# Patient Record
Sex: Female | Born: 1970 | Race: White | Hispanic: No | Marital: Married | Smoking: Never smoker
Health system: Southern US, Community
[De-identification: ages and names within clinical notes are randomized; demographics above are authoritative.]

## PROBLEM LIST (undated history)

## (undated) DIAGNOSIS — J45909 Unspecified asthma, uncomplicated: Secondary | ICD-10-CM

## (undated) DIAGNOSIS — F909 Attention-deficit hyperactivity disorder, unspecified type: Secondary | ICD-10-CM

## (undated) DIAGNOSIS — E039 Hypothyroidism, unspecified: Secondary | ICD-10-CM

## (undated) DIAGNOSIS — R7989 Other specified abnormal findings of blood chemistry: Secondary | ICD-10-CM

## (undated) DIAGNOSIS — J309 Allergic rhinitis, unspecified: Secondary | ICD-10-CM

## (undated) HISTORY — DX: Unspecified asthma, uncomplicated: J45.909

## (undated) HISTORY — DX: Hypothyroidism, unspecified: E03.9

## (undated) HISTORY — DX: Other specified abnormal findings of blood chemistry: R79.89

## (undated) HISTORY — DX: Attention-deficit hyperactivity disorder, unspecified type: F90.9

## (undated) HISTORY — DX: Allergic rhinitis, unspecified: J30.9

---
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
## 1994-10-05 HISTORY — PX: TUBAL LIGATION: SHX77

## 2001-10-05 HISTORY — PX: BREAST ENHANCEMENT SURGERY: SHX7

## 2010-10-05 HISTORY — PX: INCISE AND DRAIN ABCESS: PRO64

## 2011-09-07 DIAGNOSIS — L290 Pruritus ani: Secondary | ICD-10-CM | POA: Insufficient documentation

## 2011-10-08 DIAGNOSIS — J309 Allergic rhinitis, unspecified: Secondary | ICD-10-CM | POA: Insufficient documentation

## 2012-06-29 DIAGNOSIS — R7989 Other specified abnormal findings of blood chemistry: Secondary | ICD-10-CM | POA: Insufficient documentation

## 2012-06-29 DIAGNOSIS — R635 Abnormal weight gain: Secondary | ICD-10-CM | POA: Insufficient documentation

## 2013-05-16 DIAGNOSIS — F988 Other specified behavioral and emotional disorders with onset usually occurring in childhood and adolescence: Secondary | ICD-10-CM | POA: Insufficient documentation

## 2013-08-14 ENCOUNTER — Ambulatory Visit (INDEPENDENT_AMBULATORY_CARE_PROVIDER_SITE_OTHER): Payer: BC Managed Care – PPO | Admitting: Critical Care Medicine

## 2013-08-14 ENCOUNTER — Encounter: Payer: Self-pay | Admitting: Critical Care Medicine

## 2013-08-14 VITALS — BP 122/86 | HR 77 | Temp 98.7°F | Ht 63.0 in | Wt 141.0 lb

## 2013-08-14 DIAGNOSIS — J45909 Unspecified asthma, uncomplicated: Secondary | ICD-10-CM

## 2013-08-14 DIAGNOSIS — J4551 Severe persistent asthma with (acute) exacerbation: Secondary | ICD-10-CM | POA: Insufficient documentation

## 2013-08-14 DIAGNOSIS — E039 Hypothyroidism, unspecified: Secondary | ICD-10-CM | POA: Insufficient documentation

## 2013-08-14 DIAGNOSIS — F909 Attention-deficit hyperactivity disorder, unspecified type: Secondary | ICD-10-CM | POA: Insufficient documentation

## 2013-08-14 DIAGNOSIS — J45901 Unspecified asthma with (acute) exacerbation: Secondary | ICD-10-CM

## 2013-08-14 MED ORDER — MOMETASONE FURO-FORMOTEROL FUM 200-5 MCG/ACT IN AERO: 2.0000 | INHALATION_SPRAY | Freq: Two times a day (BID) | RESPIRATORY_TRACT | Status: DC

## 2013-08-14 MED ORDER — ALBUTEROL SULFATE HFA 108 (90 BASE) MCG/ACT IN AERS: 2.0000 | INHALATION_SPRAY | RESPIRATORY_TRACT | Status: DC | PRN

## 2013-08-14 MED ORDER — MONTELUKAST SODIUM 10 MG PO TABS: 10.0000 mg | ORAL_TABLET | Freq: Every day | ORAL | Status: DC

## 2013-08-14 MED ORDER — PREDNISONE 10 MG PO TABS: ORAL_TABLET | ORAL | Status: DC

## 2013-08-14 MED ORDER — ALBUTEROL SULFATE (2.5 MG/3ML) 0.083% IN NEBU: 2.5000 mg | INHALATION_SOLUTION | RESPIRATORY_TRACT | Status: DC | PRN

## 2013-08-14 NOTE — Progress Notes (Signed)
  Subjective:    Patient ID: Alexis Duffy, female    DOB: July 20, 1971, 42 y.o.   MRN: [id number]  HPI Comments: Dx asthma since age 68. Chronic symptoms since.  Notes 68/69 pos allergy tests. Stopped allergy immunotherapy and did not do well Now doing poorly, using neb albuterol BID, SABA rescue BID  Asthma She complains of chest tightness, cough, difficulty breathing, frequent throat clearing, hoarse voice, shortness of breath, sputum production and wheezing. There is no hemoptysis. Primary symptoms comments: Cough is dry, if takes a breathing Tx will loosen up Mucus can be yellow/tinge. This is a chronic problem. The current episode started more than 1 year ago. The problem occurs constantly (much worse for past 7weeks, has nightly resp distress). The problem has been rapidly worsening. The cough is productive of sputum, barking, productive, hoarse, nocturnal and paroxysmal. Associated symptoms include dyspnea on exertion, malaise/fatigue, nasal congestion, PND, postnasal drip and rhinorrhea. Pertinent negatives include no appetite change, chest pain, ear congestion, ear pain, fever, headaches, heartburn, myalgias, orthopnea, sneezing, sore throat, sweats, trouble swallowing or weight loss. Associated symptoms comments: Two months ago dx otitis 45mo ago, frontal sinus pain No imaging done of sinuses. Her symptoms are aggravated by any activity, change in weather, exercise, emotional stress, exposure to smoke, exposure to fumes, occupational exposure, URI, strenuous activity, climbing stairs, animal exposure and pollen. Her symptoms are alleviated by beta-agonist, oral steroids and steroid inhaler. She reports moderate improvement on treatment. Risk factors for lung disease include smoking/tobacco exposure (passive smoke exposure). Her past medical history is significant for asthma and bronchitis. There is no history of bronchiectasis, COPD, emphysema or pneumonia.   Skin testing 2010. Asthma allergy  [facility] chest dr Chevis Pretty    Review of Systems  Constitutional: Positive for malaise/fatigue and fatigue. Negative for fever, chills, weight loss, diaphoresis, activity change, appetite change and unexpected weight change.  HENT: Positive for congestion, hoarse voice, postnasal drip, rhinorrhea and voice change. Negative for dental problem, ear discharge, ear pain, facial swelling, hearing loss, mouth sores, nosebleeds, sinus pressure, sneezing, sore throat, tinnitus and trouble swallowing.   Eyes: Negative for photophobia, discharge, itching and visual disturbance.  Respiratory: Positive for cough, sputum production, chest tightness, shortness of breath and wheezing. Negative for apnea, hemoptysis, choking and stridor.   Cardiovascular: Positive for dyspnea on exertion, leg swelling and PND. Negative for chest pain and palpitations.  Gastrointestinal: Negative for heartburn, nausea, vomiting, abdominal pain, constipation, blood in stool and abdominal distention.  Genitourinary: Negative for dysuria, urgency, frequency, hematuria, flank pain, decreased urine volume and difficulty urinating.  Musculoskeletal: Negative for arthralgias, back pain, gait problem, joint swelling, myalgias, neck pain and neck stiffness.  Skin: Negative for color change, pallor and rash.  Neurological: Negative for dizziness, tremors, seizures, syncope, speech difficulty, weakness, light-headedness, numbness and headaches.  Hematological: Negative for adenopathy. Does not bruise/bleed easily.  Psychiatric/Behavioral: Positive for sleep disturbance. Negative for confusion and agitation. The patient is not nervous/anxious.        Objective:   Physical Exam        Assessment & Plan:

## 2013-08-14 NOTE — Patient Instructions (Addendum)
REsume Dulera 200 two puff twice daily Repulse prednisone10mg  Take 4 for two days three for two days two for two days one for two days Use a peak flow meter to monitor your flow rates twice daily, use diary to record results Albuterol as needed refilled Stay on singulair REcords from prior physicians will be obtained, signed release Allergy labs today, possible Xolair candidate, sign forms for insurance screening Return 1 month

## 2013-08-15 ENCOUNTER — Encounter: Payer: Self-pay | Admitting: Critical Care Medicine

## 2013-08-15 ENCOUNTER — Telehealth: Payer: Self-pay | Admitting: Critical Care Medicine

## 2013-08-15 LAB — ALLERGY FULL PROFILE
Allergen, D pternoyssinus,d7: 7.7 kU/L — ABNORMAL HIGH
Allergen,Goose feathers, e70: 0.23 kU/L — ABNORMAL HIGH
Bahia Grass: 5.12 kU/L — ABNORMAL HIGH
Box Elder IgE: 2.26 kU/L — ABNORMAL HIGH
Common Ragweed: 2.13 kU/L — ABNORMAL HIGH
Curvularia lunata: 0.28 kU/L — ABNORMAL HIGH
Elm IgE: 0.24 kU/L — ABNORMAL HIGH
Fescue: 14.6 kU/L — ABNORMAL HIGH
G005 Rye, Perennial: 13.6 kU/L — ABNORMAL HIGH
G009 Red Top: 14.5 kU/L — ABNORMAL HIGH
Goldenrod: 0.33 kU/L — ABNORMAL HIGH
Helminthosporium halodes: 0.78 kU/L — ABNORMAL HIGH
House Dust Hollister: 32.4 kU/L — ABNORMAL HIGH
IgE (Immunoglobulin E), Serum: 473.7 IU/mL — ABNORMAL HIGH (ref 0.0–180.0)
Oak: 0.64 kU/L — ABNORMAL HIGH
Stemphylium Botryosum: <0.1 kU/L
Timothy Grass: 7.08 kU/L — ABNORMAL HIGH

## 2013-08-15 MED ORDER — MOMETASONE FURO-FORMOTEROL FUM 200-5 MCG/ACT IN AERO: 2.0000 | INHALATION_SPRAY | Freq: Two times a day (BID) | RESPIRATORY_TRACT | Status: DC

## 2013-08-15 NOTE — Telephone Encounter (Signed)
I called and spoke with pt. She voiced her understanding and nothing further needed

## 2013-08-15 NOTE — Telephone Encounter (Signed)
LMTC x 1 - Spoke with pharmacist at [facility] in [place].  Prednisone and Ventolin have been picked up.  Albuterol neb and Singulair are at pharmacy ready to be picked up.   [name] was sent electronically today.

## 2013-08-15 NOTE — Assessment & Plan Note (Signed)
Severe asthma d/t severe and multiple atopic features,  See positive RAST and IgE levels.  This pt would benefit from Xolair plan REsume Dulera 200 two puff twice daily Repulse prednisone10mg  Take 4 for two days three for two days two for two days one for two days Use a peak flow meter to monitor your flow rates twice daily, use diary to record results Albuterol as needed refilled Stay on singulair REcords from prior physicians will be obtained, signed release Allergy labs today, possible Xolair candidate, sign forms for insurance screening Return 1 month

## 2013-08-16 ENCOUNTER — Telehealth: Payer: Self-pay | Admitting: Critical Care Medicine

## 2013-08-16 DIAGNOSIS — J4551 Severe persistent asthma with (acute) exacerbation: Secondary | ICD-10-CM

## 2013-08-16 NOTE — Telephone Encounter (Signed)
ATC PT NA received a message that VM was not set up yet [facility]

## 2013-08-17 NOTE — Telephone Encounter (Signed)
Will forward to PW so that he can be aware to call the pt after 4 pm today at [phone]  Thanks

## 2013-08-17 NOTE — Telephone Encounter (Signed)
Spoke with the pt and she states she will be in class today from 1-4 but can be reached before or after this time on # [phone]. Carron Curie, CMA

## 2013-08-17 NOTE — Telephone Encounter (Signed)
noted 

## 2013-08-18 NOTE — Telephone Encounter (Signed)
Her dose will be :  300mg  IM /SQ every 2 weeks

## 2013-08-18 NOTE — Progress Notes (Signed)
Quick Note:  Xolair process has been started. Please see phone msg from 08/16/13 for additional information. ______

## 2013-08-18 NOTE — Telephone Encounter (Signed)
Per lab result notes from Dr. Delford Field:  Result Note    Pt aware of results. SHe needs to start Xolair ASAP. Please process   --------  Dr. Delford Field, pls advise on the xolair dosage and frequency for this pt, so I can place order for [facility].  Thank you.  I called, spoke with pt.  She will need to sign a form so Johny Drilling can start the [employer] process as well.  Pt states I can fax this form to her at [phone].  I have faxed form.  Pt aware.  Pt will complete and sign it and fax back to my attn in triage.  She will call when she is faxing form back so we can be on the look out for it.

## 2013-08-18 NOTE — Telephone Encounter (Signed)
We have received the xolair pt authorization form back from pt.  I have given this to [place] to start the process.  Will route msg back to Dr. Delford Field to advise on dosage and frequency.  Thank you.

## 2013-08-21 ENCOUNTER — Encounter: Payer: Self-pay | Admitting: Critical Care Medicine

## 2013-08-21 DIAGNOSIS — J302 Other seasonal allergic rhinitis: Secondary | ICD-10-CM | POA: Insufficient documentation

## 2013-08-21 DIAGNOSIS — J3089 Other allergic rhinitis: Secondary | ICD-10-CM

## 2013-08-21 NOTE — Telephone Encounter (Signed)
New Start Xolair order placed. Will route to [place] so she is aware. Johny Drilling, will you please process this ASAP? Dr. Delford Field would like pt to start ASAP.   Thank you!

## 2013-08-23 NOTE — Telephone Encounter (Signed)
noted 

## 2013-08-24 NOTE — Telephone Encounter (Signed)
We have started the Xolair process, correct?

## 2013-09-04 NOTE — Telephone Encounter (Signed)
yes

## 2013-09-19 ENCOUNTER — Ambulatory Visit (INDEPENDENT_AMBULATORY_CARE_PROVIDER_SITE_OTHER): Payer: BC Managed Care – PPO | Admitting: Critical Care Medicine

## 2013-09-19 ENCOUNTER — Encounter: Payer: Self-pay | Admitting: Critical Care Medicine

## 2013-09-19 ENCOUNTER — Other Ambulatory Visit: Payer: Self-pay | Admitting: *Deleted

## 2013-09-19 VITALS — BP 136/88 | HR 69 | Temp 98.2°F | Ht 63.0 in | Wt 138.5 lb

## 2013-09-19 DIAGNOSIS — J45901 Unspecified asthma with (acute) exacerbation: Secondary | ICD-10-CM

## 2013-09-19 DIAGNOSIS — J4551 Severe persistent asthma with (acute) exacerbation: Secondary | ICD-10-CM

## 2013-09-19 MED ORDER — LEVOFLOXACIN 500 MG PO TABS: 500.0000 mg | ORAL_TABLET | Freq: Every day | ORAL | Status: DC

## 2013-09-19 MED ORDER — EPINEPHRINE 0.3 MG/0.3ML IJ SOAJ: 0.3000 mg | Freq: Once | INTRAMUSCULAR | Status: DC

## 2013-09-19 NOTE — Assessment & Plan Note (Signed)
Severe persistent asthma with significant atopic features Multiple positive allergies on RAST assay Significant elevation IgE levels Failure to respond to allergy immunotherapy over the past 2 years Frequent exacerbations Plan The patient's to Xolair has finally arrived to the office and will initiate therapy ASAP Maintain other inhaled medications as prescribed A seven-day course of Levaquin was given to the patient showed her current symptom complex worsened she will call if she is to use this medication

## 2013-09-19 NOTE — Patient Instructions (Signed)
If cough worsens, go ahead and fill Levaquin and call us to let us know you started the medication No other medication changes We will get you started on Xolair ASAP at the main office No other medication changes Return 3 months

## 2013-09-19 NOTE — Progress Notes (Signed)
Subjective:    Patient ID: Alexis Duffy, female    DOB: 1971-08-24, 42 y.o.   MRN: [id number]  HPI Comments: Dx asthma since age 50. Chronic symptoms since.  Notes 69/69 pos allergy tests. Stopped allergy immunotherapy and did not do well Now doing poorly, using neb albuterol BID, SABA rescue BID   09/19/2013 Chief Complaint  Patient presents with  . 1 month follow up    Breathing is doing much better.  Has ear pressure, nasal congestion, and cough with yellow mucus x 1 day. No SOB, wheezing, chest tightness/pain, or f/c/s.    Dyspnea is better. Pt notes R ear issues and some nasal drainage PFR:  300-320, sl worse recently Now off allergy immunotherapy    Review of Systems  Constitutional: Positive for fatigue. Negative for chills, diaphoresis, activity change and unexpected weight change.  HENT: Positive for congestion and voice change. Negative for dental problem, ear discharge, facial swelling, hearing loss, mouth sores, nosebleeds, sinus pressure and tinnitus.   Eyes: Negative for photophobia, discharge, itching and visual disturbance.  Respiratory: Positive for chest tightness. Negative for apnea, choking and stridor.   Cardiovascular: Positive for leg swelling. Negative for palpitations.  Gastrointestinal: Negative for nausea, vomiting, abdominal pain, constipation, blood in stool and abdominal distention.  Genitourinary: Negative for dysuria, urgency, frequency, hematuria, flank pain, decreased urine volume and difficulty urinating.  Musculoskeletal: Negative for arthralgias, back pain, gait problem, joint swelling, neck pain and neck stiffness.  Skin: Negative for color change, pallor and rash.  Neurological: Negative for dizziness, tremors, seizures, syncope, speech difficulty, weakness, light-headedness and numbness.  Hematological: Negative for adenopathy. Does not bruise/bleed easily.  Psychiatric/Behavioral: Positive for sleep disturbance. Negative for confusion and  agitation. The patient is not nervous/anxious.        Objective:   Physical Exam  Filed Vitals:   09/19/13 1631  BP: 136/88  Pulse: 69  Temp: 98.2 F (36.8 C)  TempSrc: Oral  Height: 5\' 3"  (1.6 m)  Weight: 138 lb 8 oz (62.823 kg)  SpO2: 99%    Gen: Pleasant, well-nourished, in no distress,  normal affect  ENT: No lesions,  mouth clear,  oropharynx clear, no postnasal drip  Neck: No JVD, no TMG, no carotid bruits  Lungs: No use of accessory muscles, no dullness to percussion, clear without rales or rhonchi  Cardiovascular: RRR, heart sounds normal, no murmur or gallops, no peripheral edema  Abdomen: soft and NT, no HSM,  BS normal  Musculoskeletal: No deformities, no cyanosis or clubbing  Neuro: alert, non focal  Skin: Warm, no lesions or rashes  No results found.  All records were reviewed from allergist in pulmonary medicine from [facility]       Assessment & Plan:   Severe persistent allergic asthma with acute exacerbation Severe persistent asthma with significant atopic features Multiple positive allergies on RAST assay Significant elevation IgE levels Failure to respond to allergy immunotherapy over the past 2 years Frequent exacerbations Plan The patient's to Xolair has finally arrived to the office and will initiate therapy ASAP Maintain other inhaled medications as prescribed A seven-day course of Levaquin was given to the patient showed her current symptom complex worsened she will call if she is to use this medication    Updated Medication List Outpatient Encounter Prescriptions as of 09/19/2013  Medication Sig  . albuterol (PROVENTIL HFA;VENTOLIN HFA) 108 (90 BASE) MCG/ACT inhaler Inhale 2 puffs into the lungs every 4 (four) hours as needed for wheezing or shortness of breath.  Marland Kitchen  albuterol (PROVENTIL) (2.5 MG/3ML) 0.083% nebulizer solution Take 3 mLs (2.5 mg total) by nebulization every 4 (four) hours as needed for wheezing or shortness of  breath.  . amphetamine-dextroamphetamine (ADDERALL) 20 MG tablet Take 20 mg by mouth 3 (three) times daily.  Marland Kitchen guaiFENesin (ROBITUSSIN) 100 MG/5ML liquid Take 15 mLs by mouth 2 (two) times daily.  . mometasone-formoterol (DULERA) 200-5 MCG/ACT AERO Inhale 2 puffs into the lungs 2 (two) times daily.  . montelukast (SINGULAIR) 10 MG tablet Take 1 tablet (10 mg total) by mouth at bedtime.  . progesterone (PROMETRIUM) 100 MG capsule Take 200 mg by mouth daily. Days 17-28 of menstrual cycle  . TESTOSTERONE PROPIONATE TD 4% cream - Apply 0.25 gram topically daily  . Thyroid (NATURE-THROID PO) Take 125 mcg by mouth daily.  Marland Kitchen levofloxacin (LEVAQUIN) 500 MG tablet Take 1 tablet (500 mg total) by mouth daily.  . [DISCONTINUED] predniSONE (DELTASONE) 10 MG tablet Take 4 for two days three for two days two for two days one for two days

## 2013-09-20 ENCOUNTER — Ambulatory Visit (INDEPENDENT_AMBULATORY_CARE_PROVIDER_SITE_OTHER): Payer: BC Managed Care – PPO

## 2013-09-20 DIAGNOSIS — J45909 Unspecified asthma, uncomplicated: Secondary | ICD-10-CM

## 2013-09-21 MED ORDER — OMALIZUMAB 150 MG ~~LOC~~ SOLR
300.0000 mg | Freq: Once | SUBCUTANEOUS | Status: AC
  Administered 2013-09-21: 300 mg via SUBCUTANEOUS

## 2013-09-26 ENCOUNTER — Telehealth: Payer: Self-pay | Admitting: Critical Care Medicine

## 2013-09-26 NOTE — Telephone Encounter (Signed)
Will route to PW so that he will be aware.

## 2013-09-26 NOTE — Telephone Encounter (Signed)
noted 

## 2013-10-04 ENCOUNTER — Ambulatory Visit (INDEPENDENT_AMBULATORY_CARE_PROVIDER_SITE_OTHER): Payer: BC Managed Care – PPO

## 2013-10-04 DIAGNOSIS — J45909 Unspecified asthma, uncomplicated: Secondary | ICD-10-CM

## 2013-10-04 MED ORDER — OMALIZUMAB 150 MG ~~LOC~~ SOLR
300.0000 mg | Freq: Once | SUBCUTANEOUS | Status: AC
  Administered 2013-10-04: 300 mg via SUBCUTANEOUS

## 2013-10-09 ENCOUNTER — Telehealth: Payer: Self-pay | Admitting: Critical Care Medicine

## 2013-10-09 NOTE — Telephone Encounter (Signed)
Received a fax from [facility] for the pt's Xolair. They are needing the current asthma therapy is on. Advised them of Dulera, Albuterol inhaler and nebulizer solution. Nothing further was needed at this time.

## 2013-10-18 ENCOUNTER — Ambulatory Visit (INDEPENDENT_AMBULATORY_CARE_PROVIDER_SITE_OTHER): Payer: BC Managed Care – PPO

## 2013-10-18 DIAGNOSIS — J45909 Unspecified asthma, uncomplicated: Secondary | ICD-10-CM

## 2013-10-20 MED ORDER — OMALIZUMAB 150 MG ~~LOC~~ SOLR
300.0000 mg | Freq: Once | SUBCUTANEOUS | Status: AC
  Administered 2013-10-20: 300 mg via SUBCUTANEOUS

## 2013-11-01 ENCOUNTER — Ambulatory Visit (INDEPENDENT_AMBULATORY_CARE_PROVIDER_SITE_OTHER): Payer: BC Managed Care – PPO

## 2013-11-01 DIAGNOSIS — J45909 Unspecified asthma, uncomplicated: Secondary | ICD-10-CM

## 2013-11-02 MED ORDER — OMALIZUMAB 150 MG ~~LOC~~ SOLR
300.0000 mg | Freq: Once | SUBCUTANEOUS | Status: AC
  Administered 2013-11-02: 300 mg via SUBCUTANEOUS

## 2013-11-15 ENCOUNTER — Ambulatory Visit (INDEPENDENT_AMBULATORY_CARE_PROVIDER_SITE_OTHER): Payer: BC Managed Care – PPO

## 2013-11-15 DIAGNOSIS — J45909 Unspecified asthma, uncomplicated: Secondary | ICD-10-CM

## 2013-11-16 MED ORDER — OMALIZUMAB 150 MG ~~LOC~~ SOLR
300.0000 mg | Freq: Once | SUBCUTANEOUS | Status: AC
  Administered 2013-11-16: 300 mg via SUBCUTANEOUS

## 2013-11-29 ENCOUNTER — Ambulatory Visit: Payer: BC Managed Care – PPO

## 2013-12-01 ENCOUNTER — Ambulatory Visit (INDEPENDENT_AMBULATORY_CARE_PROVIDER_SITE_OTHER): Payer: BC Managed Care – PPO

## 2013-12-01 DIAGNOSIS — J45909 Unspecified asthma, uncomplicated: Secondary | ICD-10-CM

## 2013-12-05 MED ORDER — OMALIZUMAB 150 MG ~~LOC~~ SOLR
300.0000 mg | Freq: Once | SUBCUTANEOUS | Status: AC
  Administered 2013-12-05: 300 mg via SUBCUTANEOUS

## 2013-12-15 ENCOUNTER — Ambulatory Visit (INDEPENDENT_AMBULATORY_CARE_PROVIDER_SITE_OTHER): Payer: BC Managed Care – PPO

## 2013-12-15 DIAGNOSIS — J45909 Unspecified asthma, uncomplicated: Secondary | ICD-10-CM

## 2013-12-19 MED ORDER — OMALIZUMAB 150 MG ~~LOC~~ SOLR
300.0000 mg | Freq: Once | SUBCUTANEOUS | Status: AC
  Administered 2013-12-19: 300 mg via SUBCUTANEOUS

## 2013-12-29 ENCOUNTER — Ambulatory Visit (INDEPENDENT_AMBULATORY_CARE_PROVIDER_SITE_OTHER): Payer: BC Managed Care – PPO

## 2013-12-29 DIAGNOSIS — J45909 Unspecified asthma, uncomplicated: Secondary | ICD-10-CM

## 2014-01-01 MED ORDER — OMALIZUMAB 150 MG ~~LOC~~ SOLR
300.0000 mg | Freq: Once | SUBCUTANEOUS | Status: AC
  Administered 2014-01-01: 300 mg via SUBCUTANEOUS

## 2014-01-12 ENCOUNTER — Ambulatory Visit: Payer: BC Managed Care – PPO

## 2014-01-31 ENCOUNTER — Ambulatory Visit (INDEPENDENT_AMBULATORY_CARE_PROVIDER_SITE_OTHER): Payer: BC Managed Care – PPO

## 2014-01-31 DIAGNOSIS — J45909 Unspecified asthma, uncomplicated: Secondary | ICD-10-CM

## 2014-02-01 MED ORDER — OMALIZUMAB 150 MG ~~LOC~~ SOLR
300.0000 mg | Freq: Once | SUBCUTANEOUS | Status: AC
  Administered 2014-02-01: 300 mg via SUBCUTANEOUS

## 2014-02-16 ENCOUNTER — Ambulatory Visit (INDEPENDENT_AMBULATORY_CARE_PROVIDER_SITE_OTHER): Payer: BC Managed Care – PPO

## 2014-02-16 DIAGNOSIS — J45909 Unspecified asthma, uncomplicated: Secondary | ICD-10-CM

## 2014-02-21 ENCOUNTER — Other Ambulatory Visit: Payer: BC Managed Care – PPO

## 2014-02-21 ENCOUNTER — Ambulatory Visit (INDEPENDENT_AMBULATORY_CARE_PROVIDER_SITE_OTHER): Payer: BC Managed Care – PPO | Admitting: Critical Care Medicine

## 2014-02-21 ENCOUNTER — Encounter: Payer: Self-pay | Admitting: Critical Care Medicine

## 2014-02-21 VITALS — BP 114/84 | HR 68 | Temp 98.1°F | Ht 63.0 in | Wt 132.0 lb

## 2014-02-21 DIAGNOSIS — J4550 Severe persistent asthma, uncomplicated: Secondary | ICD-10-CM

## 2014-02-21 DIAGNOSIS — J45909 Unspecified asthma, uncomplicated: Secondary | ICD-10-CM

## 2014-02-21 NOTE — Patient Instructions (Signed)
Stay on all medications and Xolair Allergy test for food allergy today (blood test) Return 4 months

## 2014-02-22 NOTE — Assessment & Plan Note (Signed)
Severe persistent asthma with market atopic and multiple positive skin tests Need to rule out food allergies Patient now starting on Xolair therapy Plan Stay on all medications and Xolair Allergy test for food allergy today (blood test) Return 4 months

## 2014-02-22 NOTE — Progress Notes (Signed)
Subjective:    Patient ID: Alexis Duffy, female    DOB: Nov 20, 1970, 43 y.o.   MRN: [id number]  HPI  02/21/2014 Chief Complaint  Patient presents with  . 5 month follow up    Breathing doing well overall.  Occas SOB or wheezing with workouts.  Throat clearing.  No issues. Now on xolair . Doing better since start Xolair Severe allergic features on testing.  Needs food allergy testing Pt denies any significant sore throat, nasal congestion or excess secretions, fever, chills, sweats, unintended weight loss, pleurtic or exertional chest pain, orthopnea PND, or leg swelling Pt denies any increase in rescue therapy over baseline, denies waking up needing it or having any early am or nocturnal exacerbations of coughing/wheezing/or dyspnea. Pt also denies any obvious fluctuation in symptoms with  weather or environmental change or other alleviating or aggravating factors  PUL ASTHMA HISTORY 02/22/2014  Symptoms >2 days/week  Interference with activity No limitations  SABA use 0-2 days/wk  Exacerbations requiring oral steroids 2 or more / year      Review of Systems  Constitutional: Positive for fatigue. Negative for chills, diaphoresis, activity change and unexpected weight change.  HENT: Positive for congestion and voice change. Negative for dental problem, ear discharge, facial swelling, hearing loss, mouth sores, nosebleeds, sinus pressure and tinnitus.   Eyes: Negative for photophobia, discharge, itching and visual disturbance.  Respiratory: Positive for chest tightness. Negative for apnea, choking and stridor.   Cardiovascular: Positive for leg swelling. Negative for palpitations.  Gastrointestinal: Negative for nausea, vomiting, abdominal pain, constipation, blood in stool and abdominal distention.  Genitourinary: Negative for dysuria, urgency, frequency, hematuria, flank pain, decreased urine volume and difficulty urinating.  Musculoskeletal: Negative for arthralgias, back pain,  gait problem, joint swelling, neck pain and neck stiffness.  Skin: Negative for color change, pallor and rash.  Neurological: Negative for dizziness, tremors, seizures, syncope, speech difficulty, weakness, light-headedness and numbness.  Hematological: Negative for adenopathy. Does not bruise/bleed easily.  Psychiatric/Behavioral: Positive for sleep disturbance. Negative for confusion and agitation. The patient is not nervous/anxious.        Objective:   Physical Exam  Filed Vitals:   02/21/14 1204  BP: 114/84  Pulse: 68  Temp: 98.1 F (36.7 C)  TempSrc: Oral  Height: 5\' 3"  (1.6 m)  Weight: 132 lb (59.875 kg)  SpO2: 98%    Gen: Pleasant, well-nourished, in no distress,  normal affect  ENT: No lesions,  mouth clear,  oropharynx clear, no postnasal drip  Neck: No JVD, no TMG, no carotid bruits  Lungs: No use of accessory muscles, no dullness to percussion, clear without rales or rhonchi  Cardiovascular: RRR, heart sounds normal, no murmur or gallops, no peripheral edema  Abdomen: soft and NT, no HSM,  BS normal  Musculoskeletal: No deformities, no cyanosis or clubbing  Neuro: alert, non focal  Skin: Warm, no lesions or rashes  No results found.      Assessment & Plan:   Severe persistent allergic asthma with acute exacerbation Severe persistent asthma with market atopic and multiple positive skin tests Need to rule out food allergies Patient now starting on Xolair therapy Plan Stay on all medications and Xolair Allergy test for food allergy today (blood test) Return 4 months    Updated Medication List Outpatient Encounter Prescriptions as of 02/21/2014  Medication Sig  . albuterol (PROVENTIL HFA;VENTOLIN HFA) 108 (90 BASE) MCG/ACT inhaler Inhale 2 puffs into the lungs every 4 (four) hours as needed for wheezing or shortness  of breath.  Marland Kitchen. albuterol (PROVENTIL) (2.5 MG/3ML) 0.083% nebulizer solution Take 3 mLs (2.5 mg total) by nebulization every 4 (four)  hours as needed for wheezing or shortness of breath.  . amphetamine-dextroamphetamine (ADDERALL) 20 MG tablet Take 20 mg by mouth 3 (three) times daily.  Marland Kitchen. EPINEPHrine (EPI-PEN) 0.3 mg/0.3 mL SOAJ injection Inject 0.3 mLs (0.3 mg total) into the muscle once.  . mometasone-formoterol (DULERA) 200-5 MCG/ACT AERO Inhale 2 puffs into the lungs 2 (two) times daily.  . montelukast (SINGULAIR) 10 MG tablet Take 1 tablet (10 mg total) by mouth at bedtime.  Marland Kitchen. omalizumab (XOLAIR) 150 MG injection Inject 300 mg into the skin every 14 (fourteen) days.   . Thyroid (NATURE-THROID PO) Take 125 mcg by mouth daily.  . progesterone (PROMETRIUM) 100 MG capsule On hold  . TESTOSTERONE PROPIONATE TD On hold  . [DISCONTINUED] guaiFENesin (ROBITUSSIN) 100 MG/5ML liquid Take 15 mLs by mouth 2 (two) times daily.  . [DISCONTINUED] levofloxacin (LEVAQUIN) 500 MG tablet Take 1 tablet (500 mg total) by mouth daily.

## 2014-02-27 LAB — IGG FOOD PANEL
ALLERGEN BEEF IGG: 6.1 ug/mL — AB (ref <2.0)
Allergen, Milk, IgG: 13.1 ug/mL — ABNORMAL HIGH (ref <0.15)
Chicken, IgG: <0.15 ug/mL (ref <0.15)
Corn, IgG: <0.15 ug/mL (ref <0.15)
Egg yolk, IgG: <2 ug/mL (ref <2.0)

## 2014-03-01 MED ORDER — OMALIZUMAB 150 MG ~~LOC~~ SOLR
300.0000 mg | Freq: Once | SUBCUTANEOUS | Status: AC
  Administered 2014-03-01: 300 mg via SUBCUTANEOUS

## 2014-03-01 NOTE — Progress Notes (Signed)
Quick Note:  Called, spoke with pt. Informed her of lab results and recs per Dr. Delford Field. She verbalized understanding. She would like to proceed with Allergy eval and is requesting a Friday appt. First avail Friday with CY is July 10 -- pt ok with this date. We have scheduled Allergy Eval for July 10 at 3:45 pm -- pt aware and voiced no further questions or concerns at this time. ______

## 2014-03-02 ENCOUNTER — Ambulatory Visit (INDEPENDENT_AMBULATORY_CARE_PROVIDER_SITE_OTHER): Payer: BC Managed Care – PPO

## 2014-03-02 DIAGNOSIS — J45909 Unspecified asthma, uncomplicated: Secondary | ICD-10-CM

## 2014-03-06 MED ORDER — OMALIZUMAB 150 MG ~~LOC~~ SOLR
300.0000 mg | Freq: Once | SUBCUTANEOUS | Status: AC
  Administered 2014-03-06: 300 mg via SUBCUTANEOUS

## 2014-03-16 ENCOUNTER — Ambulatory Visit: Payer: BC Managed Care – PPO

## 2014-03-20 ENCOUNTER — Encounter: Payer: Self-pay | Admitting: Emergency Medicine

## 2014-03-21 ENCOUNTER — Ambulatory Visit (INDEPENDENT_AMBULATORY_CARE_PROVIDER_SITE_OTHER): Payer: BC Managed Care – PPO

## 2014-03-21 DIAGNOSIS — J45901 Unspecified asthma with (acute) exacerbation: Secondary | ICD-10-CM

## 2014-03-21 DIAGNOSIS — J4551 Severe persistent asthma with (acute) exacerbation: Secondary | ICD-10-CM

## 2014-03-22 MED ORDER — OMALIZUMAB 150 MG ~~LOC~~ SOLR
300.0000 mg | Freq: Once | SUBCUTANEOUS | Status: AC
  Administered 2014-03-22: 300 mg via SUBCUTANEOUS

## 2014-04-04 ENCOUNTER — Ambulatory Visit (INDEPENDENT_AMBULATORY_CARE_PROVIDER_SITE_OTHER): Payer: BC Managed Care – PPO

## 2014-04-04 DIAGNOSIS — J45901 Unspecified asthma with (acute) exacerbation: Secondary | ICD-10-CM

## 2014-04-04 DIAGNOSIS — J4551 Severe persistent asthma with (acute) exacerbation: Secondary | ICD-10-CM

## 2014-04-05 MED ORDER — OMALIZUMAB 150 MG ~~LOC~~ SOLR
300.0000 mg | Freq: Once | SUBCUTANEOUS | Status: AC
  Administered 2014-04-05: 300 mg via SUBCUTANEOUS

## 2014-04-13 ENCOUNTER — Institutional Professional Consult (permissible substitution): Payer: BC Managed Care – PPO | Admitting: Internal Medicine

## 2014-04-18 ENCOUNTER — Ambulatory Visit: Payer: BC Managed Care – PPO

## 2014-04-19 ENCOUNTER — Ambulatory Visit: Payer: BC Managed Care – PPO

## 2014-04-19 ENCOUNTER — Ambulatory Visit (INDEPENDENT_AMBULATORY_CARE_PROVIDER_SITE_OTHER): Payer: BC Managed Care – PPO | Admitting: Internal Medicine

## 2014-04-19 ENCOUNTER — Encounter: Payer: Self-pay | Admitting: Internal Medicine

## 2014-04-19 VITALS — BP 110/62 | HR 75 | Ht 63.0 in | Wt 127.5 lb

## 2014-04-19 DIAGNOSIS — Z91018 Allergy to other foods: Secondary | ICD-10-CM

## 2014-04-19 DIAGNOSIS — J4551 Severe persistent asthma with (acute) exacerbation: Secondary | ICD-10-CM

## 2014-04-19 DIAGNOSIS — J309 Allergic rhinitis, unspecified: Secondary | ICD-10-CM

## 2014-04-19 DIAGNOSIS — J3089 Other allergic rhinitis: Secondary | ICD-10-CM

## 2014-04-19 DIAGNOSIS — J302 Other seasonal allergic rhinitis: Secondary | ICD-10-CM

## 2014-04-19 DIAGNOSIS — J45901 Unspecified asthma with (acute) exacerbation: Secondary | ICD-10-CM

## 2014-04-19 DIAGNOSIS — J45909 Unspecified asthma, uncomplicated: Secondary | ICD-10-CM

## 2014-04-19 NOTE — Assessment & Plan Note (Signed)
Xolair may be augmenting antihistamines. She reports good spring this year. No polyps on exam, noting history that aspirin causes chest tightness.

## 2014-04-19 NOTE — Patient Instructions (Signed)
We discussed food allergy- the key is to avoid those foods you find cause problems. We can get Epipen or other measures and can test for IgE Food sensitivities later if needed.

## 2014-04-19 NOTE — Assessment & Plan Note (Signed)
Significance IgG food profiles is less well-defined than IgE. She has only associated symptoms with mushrooms incidental to eating steak or salads, and dairy which she avoids. No foods recognized to cause urticaria, angioedema, wheezing to a serious degree, or anything suggesting anaphylaxis. Plan-use food profile as a watch list for paying attention. We discussed food allergy versus food intolerance and related issues.

## 2014-04-19 NOTE — Progress Notes (Signed)
04/19/14- 43 yoF never smoker with hx severe persistent atopic asthma with hx multiple positive skin tests. Recently started Xolair.  History of asthma since age 43 years with triggers recognized to include exercise in cold air, aspirin causing chest tightness, eating mushrooms, seasonal spring and fall pollens. She was most recently skin tested at [facility] in 2010 with positive reactions to tree pollen, grass pollen, weed pollens, dust mites, cockroach, dog, cat, molds. She was on allergy vaccine for 2-1/2 years but says it made her worse. Had large local reactions. She had no problem with exposure to her dog before allergy vaccine, but while she was on vaccine where dog licked her she would get hives. This got better when she quit allergy vaccine. She began Xolair injections in January 2015 and says this was the best spring she has had in years. Allergic rhinitis was not as bad this year. She is satisfied with current asthma control. There was concern of food allergy and we reviewed  IgG profiles:elevations for beef, milk, lesser reactions to egg, corn. Total IgE 473.7 on 08/15/2013. She reports having food IgE profile checked elsewhere a year or 2 ago. No history of severe allergic response to food. No history of nasal polyps, urticaria or significant skin rashes. Environment: House with no basement. Air filters. 2 dogs. Some encasings. Mold was found in duct work which has all been replaced.  Prior to Admission medications   Medication Sig Start Date End Date Taking? Authorizing Provider  albuterol (PROVENTIL HFA;VENTOLIN HFA) 108 (90 BASE) MCG/ACT inhaler Inhale 2 puffs into the lungs every 4 (four) hours as needed for wheezing or shortness of breath. 08/14/13  Yes Storm FriskPatrick E Wright, MD  albuterol (PROVENTIL) (2.5 MG/3ML) 0.083% nebulizer solution Take 3 mLs (2.5 mg total) by nebulization every 4 (four) hours as needed for wheezing or shortness of breath. 08/14/13  Yes Storm FriskPatrick E  Wright, MD  ALPRAZolam Prudy Feeler(XANAX) 0.5 MG tablet Take 0.5 mg by mouth 2 (two) times daily as needed for anxiety.   Yes Historical Provider, MD  amphetamine-dextroamphetamine (ADDERALL) 20 MG tablet Take 20 mg by mouth 3 (three) times daily.   Yes Historical Provider, MD  EPINEPHrine (EPI-PEN) 0.3 mg/0.3 mL SOAJ injection Inject 0.3 mLs (0.3 mg total) into the muscle once. 09/19/13  Yes Storm FriskPatrick E Wright, MD  escitalopram (LEXAPRO) 20 MG tablet Take 10 mg daily for first 8 weeks(started Monday 04-16-14) then increase to 20 mg daily there after   Yes Historical Provider, MD  mometasone-formoterol (DULERA) 200-5 MCG/ACT AERO Inhale 2 puffs into the lungs 2 (two) times daily. 08/15/13  Yes Storm FriskPatrick E Wright, MD  montelukast (SINGULAIR) 10 MG tablet Take 1 tablet (10 mg total) by mouth at bedtime. 08/14/13  Yes Storm FriskPatrick E Wright, MD  omalizumab [name](XOLAIR) 150 MG injection Inject 300 mg into the skin every 14 (fourteen) days.    Yes Historical Provider, MD  progesterone (PROMETRIUM) 100 MG capsule On hold   Yes Historical Provider, MD  Thyroid (NATURE-THROID PO) Take 125 mcg by mouth daily.   Yes Historical Provider, MD  TESTOSTERONE PROPIONATE TD On hold    Historical Provider, MD   Past Medical History  Diagnosis Date  . Asthma   . Hypothyroid   . Low testosterone   . ADHD (attention deficit hyperactivity disorder)   . Allergic rhinitis    Past Surgical History  Procedure Laterality Date  . Breast enhancement surgery  2003  . Tonsillectomy  1989  . Incise and  drain abcess  2012  . Tubal ligation  1996   Family History  Problem Relation Age of Onset  . Allergies Father   . Asthma Father   . Asthma Paternal Aunt   . Heart disease Father   . Lung cancer Paternal Grandfather   . Hypothyroidism Mother   . Hypertension Brother   . Mental retardation Brother   . Hypertension Brother    History   Social History  . Marital Status: Married    Spouse Name: N/A    Number of Children: 2  . 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     Social History Main Topics  . Smoking status: Never Smoker   . Smokeless tobacco: Not on file  . Alcohol Use: Yes     Comment: 1-2 drinks per month  . Drug Use: No  . Sexual Activity: Not on file   Other Topics Concern  . Not on file   Social History Narrative  . No narrative on file   ROS-see HPI Constitutional:   No-   weight loss, night sweats, fevers, chills, fatigue, lassitude. HEENT:   +headaches, difficulty swallowing, tooth/dental problems, +sore throat,       No-  sneezing, itching, ear ache, nasal congestion, post nasal drip,  CV:  No-   chest pain, orthopnea, PND, swelling in lower extremities, anasarca,                                  dizziness, palpitations Resp: +shortness of breath with exertion or at rest.              No-   productive cough,  No non-productive cough,  No- coughing up of blood.              No-   change in color of mucus.  No- wheezing.   Skin: No-   rash or lesions. GI:  No-   heartburn, indigestion, abdominal pain, nausea, vomiting, diarrhea,                 change in bowel habits, loss of appetite GU: No-   dysuria, change in color of urine, no urgency or frequency.  No- flank pain. MS:  No-   joint pain or swelling.  No- decreased range of motion.  No- back pain. Neuro-     nothing unusual Psych:  No- change in mood or affect. No depression +anxiety.  No memory loss.  OBJ- Physical Exam  +Muscular (weight lifter on testosterone) General- Alert, Oriented, Affect-appropriate, Distress- none acute Skin- rash-none, lesions- none, excoriation- none, +  tanned Lymphadenopathy- none Head- atraumatic            Eyes- Gross vision intact, PERRLA, conjunctivae and secretions clear            Ears- Hearing, canals-normal            Nose- Clear, no-Septal dev, mucus, polyps, erosion, perforation             Throat- Mallampati II , mucosa clear , drainage-  none, tonsils- atrophic Neck- flexible , trachea midline, no stridor , thyroid nl, carotid no bruit Chest - symmetrical excursion , unlabored           Heart/CV- RRR , no murmur , no gallop  , no rub, nl s1 s2                           -  JVD- none , edema- none, stasis changes- none, varices- none           Lung- clear to P&A, wheeze- none, cough- none , dullness-none, rub- none           Chest wall-  Abd- tender-no, distended-no, bowel sounds-present, HSM- no Br/ Gen/ Rectal- Not done, not indicated Extrem- cyanosis- none, clubbing, none, atrophy- none, strength- nl Neuro- grossly intact to observation

## 2014-04-19 NOTE — Assessment & Plan Note (Addendum)
Apparent good response to Xolair now after 6 months with best spring she remembers. Currently clear on exam. Plan-given environmental dust and pollen control information. Continue present management. Discussed availability of SLIT immunotherapy

## 2014-04-23 MED ORDER — OMALIZUMAB 150 MG ~~LOC~~ SOLR
300.0000 mg | Freq: Once | SUBCUTANEOUS | Status: AC
  Administered 2014-04-23: 300 mg via SUBCUTANEOUS

## 2014-04-23 NOTE — Addendum Note (Signed)
Addended by: Ronny BaconWELCHEL, Geffrey Michaelsen C on: 04/23/2014 12:38 PM   Modules accepted: Orders

## 2014-04-30 ENCOUNTER — Other Ambulatory Visit: Payer: Self-pay | Admitting: Critical Care Medicine

## 2014-05-04 ENCOUNTER — Ambulatory Visit: Payer: BC Managed Care – PPO

## 2014-05-10 ENCOUNTER — Ambulatory Visit (INDEPENDENT_AMBULATORY_CARE_PROVIDER_SITE_OTHER): Payer: BC Managed Care – PPO

## 2014-05-10 DIAGNOSIS — J4551 Severe persistent asthma with (acute) exacerbation: Secondary | ICD-10-CM

## 2014-05-10 DIAGNOSIS — J45901 Unspecified asthma with (acute) exacerbation: Secondary | ICD-10-CM

## 2014-05-15 MED ORDER — OMALIZUMAB 150 MG ~~LOC~~ SOLR
300.0000 mg | Freq: Once | SUBCUTANEOUS | Status: AC
  Administered 2014-05-15: 300 mg via SUBCUTANEOUS

## 2014-05-24 ENCOUNTER — Ambulatory Visit (INDEPENDENT_AMBULATORY_CARE_PROVIDER_SITE_OTHER): Payer: BC Managed Care – PPO

## 2014-05-24 DIAGNOSIS — J45909 Unspecified asthma, uncomplicated: Secondary | ICD-10-CM

## 2014-05-25 MED ORDER — OMALIZUMAB 150 MG ~~LOC~~ SOLR
300.0000 mg | Freq: Once | SUBCUTANEOUS | Status: AC
  Administered 2014-05-25: 300 mg via SUBCUTANEOUS

## 2014-06-12 ENCOUNTER — Ambulatory Visit (INDEPENDENT_AMBULATORY_CARE_PROVIDER_SITE_OTHER): Payer: BC Managed Care – PPO

## 2014-06-12 DIAGNOSIS — J45909 Unspecified asthma, uncomplicated: Secondary | ICD-10-CM

## 2014-06-13 MED ORDER — OMALIZUMAB 150 MG ~~LOC~~ SOLR
300.0000 mg | Freq: Once | SUBCUTANEOUS | Status: AC
  Administered 2014-06-13: 300 mg via SUBCUTANEOUS

## 2014-06-28 ENCOUNTER — Ambulatory Visit (INDEPENDENT_AMBULATORY_CARE_PROVIDER_SITE_OTHER): Payer: BC Managed Care – PPO

## 2014-06-28 DIAGNOSIS — J4551 Severe persistent asthma with (acute) exacerbation: Secondary | ICD-10-CM

## 2014-06-28 DIAGNOSIS — J45901 Unspecified asthma with (acute) exacerbation: Secondary | ICD-10-CM

## 2014-06-29 MED ORDER — OMALIZUMAB 150 MG ~~LOC~~ SOLR
300.0000 mg | Freq: Once | SUBCUTANEOUS | Status: AC
  Administered 2014-06-29: 300 mg via SUBCUTANEOUS

## 2014-07-12 ENCOUNTER — Ambulatory Visit: Payer: BC Managed Care – PPO

## 2014-07-12 ENCOUNTER — Encounter: Payer: Self-pay | Admitting: Critical Care Medicine

## 2014-07-12 ENCOUNTER — Ambulatory Visit (INDEPENDENT_AMBULATORY_CARE_PROVIDER_SITE_OTHER): Payer: BC Managed Care – PPO | Admitting: Critical Care Medicine

## 2014-07-12 VITALS — BP 126/85 | HR 76 | Temp 97.7°F | Ht 63.0 in | Wt 135.0 lb

## 2014-07-12 DIAGNOSIS — J4551 Severe persistent asthma with (acute) exacerbation: Secondary | ICD-10-CM

## 2014-07-12 DIAGNOSIS — Z23 Encounter for immunization: Secondary | ICD-10-CM

## 2014-07-12 MED ORDER — MOMETASONE FURO-FORMOTEROL FUM 200-5 MCG/ACT IN AERO: 2.0000 | INHALATION_SPRAY | Freq: Two times a day (BID) | RESPIRATORY_TRACT | Status: DC

## 2014-07-12 MED ORDER — METHYLPREDNISOLONE ACETATE 80 MG/ML IJ SUSP
120.0000 mg | Freq: Once | INTRAMUSCULAR | Status: AC
  Administered 2014-07-12: 120 mg via INTRAMUSCULAR

## 2014-07-12 NOTE — Patient Instructions (Signed)
Depomedrol injection 120mg  was given Flu vaccine was given Hold Xolair for one week, I am ok with self injection once you are checked off No change in medications, Dulera refilled Return 4 months

## 2014-07-12 NOTE — Progress Notes (Signed)
Subjective:    Patient ID: Alexis Duffy, female    DOB: 30-Jun-1971, 43 y.o.   MRN: [id number]  Asthma She complains of shortness of breath and wheezing. There is no cough. Associated symptoms include rhinorrhea. Pertinent negatives include no chest pain, fever, postnasal drip, sore throat or trouble swallowing. Her past medical history is significant for asthma.   07/12/2014 Chief Complaint  Patient presents with  . Asthma    follow-up. Pt is c/o having chest tightness, cough, and hoarseness, and some increased SOB with activity.   Pt ill over the past weekend.  ??allergy, no tight in chest.  Notes some wheezing.  When do cardio, is worse.  Using saba daily No sinus pressure.  No mucus out of nose. No productive cough. No indigestion noted. No pndrip PUL ASTHMA HISTORY 07/12/2014 02/22/2014  Symptoms Daily >2 days/week  Nighttime awakenings 0-2/month -  Interference with activity Minor limitations No limitations  SABA use Daily 0-2 days/wk  Exacerbations requiring oral steroids 2 or more / year 2 or more / year  on xolair since 07/2014.  Overall pattern is better  No ED visits or hosp stays.  Pred twice in past year.       Review of Systems  Constitutional: Negative for fever and fatigue.  HENT: Positive for rhinorrhea and voice change. Negative for nosebleeds, postnasal drip, sinus pressure, sore throat and trouble swallowing.   Respiratory: Positive for shortness of breath and wheezing. Negative for cough.   Cardiovascular: Negative for chest pain.       Objective:   Physical Exam Filed Vitals:   07/12/14 1039  BP: 126/85  Pulse: 76  Temp: 97.7 F (36.5 C)  TempSrc: Oral  Height: 5\' 3"  (1.6 m)  Weight: 135 lb (61.236 kg)  SpO2: 98%    Gen: Pleasant, well-nourished, in no distress,  normal affect  ENT: No lesions,  mouth clear,  oropharynx clear, no postnasal drip  Neck: No JVD, no TMG, no carotid bruits  Lungs: No use of accessory muscles, no dullness to  percussion, few expir wheezes  Cardiovascular: RRR, heart sounds normal, no murmur or gallops, no peripheral edema  Abdomen: soft and NT, no HSM,  BS normal  Musculoskeletal: No deformities, no cyanosis or clubbing  Neuro: alert, non focal  Skin: Warm, no lesions or rashes  No results found.        Assessment & Plan:   Severe persistent allergic asthma with acute exacerbation Severe persistent asthma with atopic features Better on xolair, but now with mild exacerbation Plan Ok to have home based xolair injections Cont current inhaled medication program depomedrol 120mg  injection given    Flu vaccine given  Updated Medication List Outpatient Encounter Prescriptions as of 07/12/2014  Medication Sig  . albuterol (PROVENTIL HFA;VENTOLIN HFA) 108 (90 BASE) MCG/ACT inhaler Inhale 2 puffs into the lungs every 4 (four) hours as needed for wheezing or shortness of breath.  Marland Kitchen. albuterol (PROVENTIL) (2.5 MG/3ML) 0.083% nebulizer solution Take 3 mLs (2.5 mg total) by nebulization every 4 (four) hours as needed for wheezing or shortness of breath.  . ALPRAZolam (XANAX) 0.5 MG tablet Take 0.5 mg by mouth 2 (two) times daily as needed for anxiety.  Marland Kitchen. amphetamine-dextroamphetamine (ADDERALL) 20 MG tablet Take 20 mg by mouth 3 (three) times daily.  Marland Kitchen. EPINEPHrine (EPI-PEN) 0.3 mg/0.3 mL SOAJ injection Inject 0.3 mLs (0.3 mg total) into the muscle once.  . mometasone-formoterol (DULERA) 200-5 MCG/ACT AERO Inhale 2 puffs into the lungs 2 (two)  times daily.  . montelukast (SINGULAIR) 10 MG tablet Take 1 tablet (10 mg total) by mouth at bedtime.  [name] 150 MG injection INJECT 300 MG UNDER THE SKIN EVERY 2 WEEKS  . [DISCONTINUED] mometasone-formoterol (DULERA) 200-5 MCG/ACT AERO Inhale 2 puffs into the lungs 2 (two) times daily.  Marland Kitchen escitalopram (LEXAPRO) 20 MG tablet Take 10 mg daily for first 8 weeks(started Monday 04-16-14) then increase to 20 mg daily there after  . [DISCONTINUED]  progesterone (PROMETRIUM) 100 MG capsule On hold  . [DISCONTINUED] TESTOSTERONE PROPIONATE TD On hold  . [DISCONTINUED] Thyroid (NATURE-THROID PO) Take 125 mcg by mouth daily.  . [EXPIRED] methylPREDNISolone acetate (DEPO-MEDROL) injection 120 mg

## 2014-07-13 NOTE — Assessment & Plan Note (Signed)
Severe persistent asthma with atopic features Better on xolair, but now with mild exacerbation Plan Ok to have home based xolair injections Cont current inhaled medication program depomedrol 120mg  injection given

## 2014-07-19 ENCOUNTER — Ambulatory Visit: Payer: BC Managed Care – PPO

## 2014-07-20 ENCOUNTER — Ambulatory Visit (INDEPENDENT_AMBULATORY_CARE_PROVIDER_SITE_OTHER): Payer: BC Managed Care – PPO

## 2014-07-20 DIAGNOSIS — J4551 Severe persistent asthma with (acute) exacerbation: Secondary | ICD-10-CM

## 2014-07-25 MED ORDER — OMALIZUMAB 150 MG ~~LOC~~ SOLR
300.0000 mg | Freq: Once | SUBCUTANEOUS | Status: AC
  Administered 2014-07-25: 300 mg via SUBCUTANEOUS

## 2014-08-01 ENCOUNTER — Telehealth: Payer: Self-pay | Admitting: Critical Care Medicine

## 2014-08-01 NOTE — Telephone Encounter (Signed)
lmtcb X1 for pt  

## 2014-08-02 NOTE — Telephone Encounter (Signed)
lmomtcb x1 

## 2014-08-02 NOTE — Telephone Encounter (Signed)
She will need to speak with Vihan Santagata ONLY please. Thanks.

## 2014-08-03 ENCOUNTER — Ambulatory Visit: Payer: BC Managed Care – PPO

## 2014-08-10 ENCOUNTER — Ambulatory Visit (INDEPENDENT_AMBULATORY_CARE_PROVIDER_SITE_OTHER): Payer: Self-pay

## 2014-08-10 DIAGNOSIS — Z23 Encounter for immunization: Secondary | ICD-10-CM

## 2014-08-10 DIAGNOSIS — J4551 Severe persistent asthma with (acute) exacerbation: Secondary | ICD-10-CM

## 2014-08-13 NOTE — Telephone Encounter (Signed)
Pt called back and stated she would be by the office on Friday 08-10-14 to go over the instructions of Xolair. PW gave verbal order/okay to me allowing patient to take her Xolair with her and give herself her injections.   Pt will pick up her Xolair at our office (month at a time)..  Pt came by the office on Friday 08-10-14 at 3:30pm. She was given detail and printed instructions on how to mix and give herself her Xolair injections. Pt was made aware to ALWAYS keep her current/in date EPIPEN with her. If any emergency/allergic reactions should occur then patient is to seek EMERGENCY treatment and then contact our office.   Pt mixed her Xolair medication in our office and I watched her inject herself. Pt did not have any issues with mixing, administering the med, or reactions from the medication.   An Acknowledgement for Xolair patients form has been signed by the patient and scanned into EPIC for our records.   Will send to PW to acknowledge phone note and sign off on message.

## 2014-08-13 NOTE — Telephone Encounter (Signed)
I am aware of this plan of care and agree with the above

## 2014-08-24 MED ORDER — OMALIZUMAB 150 MG ~~LOC~~ SOLR
300.0000 mg | Freq: Once | SUBCUTANEOUS | Status: AC
  Administered 2014-08-24: 300 mg via SUBCUTANEOUS

## 2014-09-24 ENCOUNTER — Telehealth: Payer: Self-pay | Admitting: Critical Care Medicine

## 2014-09-24 MED ORDER — MONTELUKAST SODIUM 10 MG PO TABS: 10.0000 mg | ORAL_TABLET | Freq: Every day | ORAL | Status: DC

## 2014-09-24 MED ORDER — ALBUTEROL SULFATE HFA 108 (90 BASE) MCG/ACT IN AERS: 2.0000 | INHALATION_SPRAY | RESPIRATORY_TRACT | Status: DC | PRN

## 2014-09-24 NOTE — Telephone Encounter (Signed)
Refill sent. Pt aware.Jennifer Castillo, CMA  

## 2014-11-21 ENCOUNTER — Telehealth: Payer: Self-pay | Admitting: *Deleted

## 2014-11-21 NOTE — Telephone Encounter (Signed)
Received letter from [facility] stating that patient has not renewed her prescription for Epi Pen.  Patient takes Xolair injections and needs to have Epi Pen in case she has a reaction.  I called patient and left detailed message on her voicemail asking her to contact our office to let us know if she has an updated Epi Pen.  Awaiting call back from patient.  Letter placed in Wright's box.    To Crystal for follow up.

## 2014-11-22 ENCOUNTER — Telehealth: Payer: Self-pay | Admitting: Internal Medicine

## 2014-11-22 NOTE — Telephone Encounter (Signed)
Called pt. To let her know her Geoffry Paradisexolair it her and ready to be picked up.

## 2014-11-23 MED ORDER — EPINEPHRINE 0.3 MG/0.3ML IJ SOAJ: 0.3000 mg | Freq: Once | INTRAMUSCULAR | Status: DC

## 2014-11-23 NOTE — Telephone Encounter (Signed)
Spoke with pt. States that she does have an EpiPen at home. It is almost out of date and would like a new rx sent in. This will be taken care of. Nothing further was needed.

## 2014-11-23 NOTE — Telephone Encounter (Signed)
Pt is aware that we have her Xolair in the office. Nothing further was needed.

## 2014-11-23 NOTE — Telephone Encounter (Signed)
lmomtcb for pt 

## 2014-12-19 ENCOUNTER — Telehealth: Payer: Self-pay | Admitting: Internal Medicine

## 2014-12-19 NOTE — Telephone Encounter (Signed)
Called pt. To let her know her Geoffry Paradisexolair came in, and also she could pick it up. Leaving note open til pt. Comes in.

## 2014-12-25 NOTE — Telephone Encounter (Signed)
Called pt. Today to remind her her Geoffry Paradisexolair is here ready for pick-up. Pt. Hasn't come in yet, I last called her on the 16th. (lmom then and today.)

## 2015-01-01 NOTE — Telephone Encounter (Signed)
Pt. Came and picked up xolair 12/27/14. I'm a little late documenting because Thurs., Was crazy we were off Fri.,and I had Mon. Off.

## 2015-07-01 ENCOUNTER — Telehealth: Payer: Self-pay | Admitting: Pulmonary Disease

## 2015-07-01 DIAGNOSIS — J4551 Severe persistent asthma with (acute) exacerbation: Secondary | ICD-10-CM

## 2015-07-01 NOTE — Telephone Encounter (Signed)
lmtcb for pt.  

## 2015-07-02 MED ORDER — MOMETASONE FURO-FORMOTEROL FUM 200-5 MCG/ACT IN AERO: 2.0000 | INHALATION_SPRAY | Freq: Two times a day (BID) | RESPIRATORY_TRACT | Status: DC

## 2015-07-02 MED ORDER — ALBUTEROL SULFATE HFA 108 (90 BASE) MCG/ACT IN AERS: 2.0000 | INHALATION_SPRAY | RESPIRATORY_TRACT | Status: DC | PRN

## 2015-07-02 NOTE — Telephone Encounter (Signed)
Okay to continue until her appointment

## 2015-07-02 NOTE — Telephone Encounter (Signed)
Spoke with pt, states that she needs refills on [place], xolair, and albuterol inhaler. Pt scheduled for ov with RA next month.   Pt gets dulera and albuterol through [facility] in [place].  These have been refilled. Pt aware.  Pt unsure of where [name] is through-pt picks up at our office.   Tammy please advise on xolair refill for pt.  Thanks!  (FYI Tammy- pt was put on xolair through Dr. Delford Field, but is now following up with Dr. Vassie Loll)

## 2015-07-02 NOTE — Telephone Encounter (Signed)
[name] is has an appt. With 10/18 16. She is formally a [name] pt., he allowed her to pick up her xolair here and give her own inj. She is a Comptroller. Do you want to cont. To let her do this? If so are you ok with the rx being in your name?

## 2015-07-02 NOTE — Telephone Encounter (Signed)
I called [name] and left a message on her voicemail that I sent a note to [facility] and we're waiting on his approval.

## 2015-07-04 NOTE — Telephone Encounter (Signed)
Sorry,I forgot to check my in basket and didn't realize you had responded until today.(I was in a meeting most of the afternoon yesterday.)  I called [facility] pharm. They need pt.'s permission to ship her meds. and for her to renew her co-pay assistance before they can deliver her xolair. Just wanted to let you know. Again I apologize.

## 2015-07-05 NOTE — Telephone Encounter (Signed)
No problem.

## 2015-07-16 ENCOUNTER — Telehealth: Payer: Self-pay | Admitting: Pulmonary Disease

## 2015-07-16 NOTE — Telephone Encounter (Signed)
#   vials:4 Ordered date:07/12/15 Shipping Date:07/15/15

## 2015-07-16 NOTE — Telephone Encounter (Signed)
#   Vials:4 Arrival Date:07/16/15 Lot #:[id number] Exp Date:5/20

## 2015-07-17 ENCOUNTER — Telehealth: Payer: Self-pay | Admitting: Pulmonary Disease

## 2015-07-17 NOTE — Telephone Encounter (Signed)
Alexis Duffy - patient calling to see if her Xolair has arrived. Please advise.

## 2015-07-18 NOTE — Telephone Encounter (Signed)
Per 07/16/15 phone note, pt's Alexis Duffy has arrived.   lmtcb X1 to make pt aware.

## 2015-07-18 NOTE — Telephone Encounter (Signed)
Pt returned Alexis Duffy's call. I informed her that Geoffry Paradisexolair has arrived and is ready for pick up. Pt voiced understanding and had no further questions. Nothing further needed, will sign off on message.

## 2015-07-23 ENCOUNTER — Ambulatory Visit (INDEPENDENT_AMBULATORY_CARE_PROVIDER_SITE_OTHER): Payer: BLUE CROSS/BLUE SHIELD | Admitting: Pulmonary Disease

## 2015-07-23 ENCOUNTER — Encounter: Payer: Self-pay | Admitting: Pulmonary Disease

## 2015-07-23 VITALS — BP 110/82 | HR 92 | Ht 63.0 in | Wt 136.0 lb

## 2015-07-23 DIAGNOSIS — J4551 Severe persistent asthma with (acute) exacerbation: Secondary | ICD-10-CM | POA: Diagnosis not present

## 2015-07-23 DIAGNOSIS — J309 Allergic rhinitis, unspecified: Secondary | ICD-10-CM

## 2015-07-23 DIAGNOSIS — R0602 Shortness of breath: Secondary | ICD-10-CM | POA: Diagnosis not present

## 2015-07-23 DIAGNOSIS — J3089 Other allergic rhinitis: Secondary | ICD-10-CM

## 2015-07-23 DIAGNOSIS — J302 Other seasonal allergic rhinitis: Secondary | ICD-10-CM

## 2015-07-23 MED ORDER — MONTELUKAST SODIUM 10 MG PO TABS: 10.0000 mg | ORAL_TABLET | Freq: Every day | ORAL | Status: DC

## 2015-07-23 MED ORDER — PREDNISONE 10 MG PO TABS: ORAL_TABLET | ORAL | Status: DC

## 2015-07-23 NOTE — Addendum Note (Signed)
Addended by: York RamGAY, Mikhaela Zaugg on: 07/23/2015 05:13 PM   Modules accepted: Orders

## 2015-07-23 NOTE — Progress Notes (Signed)
   Subjective:    Patient ID: Alexis Duffy, female    DOB: Oct 23, 1970, 44 y.o.   MRN: [id number]  HPI  [age] yoF never smoker with hx severe persistent atopic asthma with hx multiple positive skin tests.  PW pt -last seen 07/2014, she works on [employer] at [employer]  History of asthma since age 44 years with triggers recognized to include exercise in cold air, aspirin causing chest tightness, eating mushrooms, seasonal spring and fall pollens. She was  skin tested at [facility] in 2010 with positive reactions to tree pollen, grass pollen, weed pollens, dust mites, cockroach, dog, cat, molds. She was on allergy vaccine for 2-1/2 years but says it made her worse. Had large local reactions. . This got better when she quit allergy vaccine.   Chief Complaint  Patient presents with  . Follow-up    Former PW pt; started back Xolair last week.  Chest tightness; some wheezing   She began Xolair injections in January 2015 with great results  - missed x 3 mnths then restarted again few wks ago  She stopped singulair, is still on dulera & albuterol prn Last flare in 2015 Seldom albuterol usage, no noct symptoms  Significant tests/ events  08/2013 Total IgE 473.7  IgG profiles:elevations for beef, milk, lesser reactions to egg, corn.  07/2014 SPiro - ratio 68, FEv1 80% 07/2015 spiro - FEV1 92%, ratio 72  Review of Systems neg for any significant sore throat, dysphagia, itching, sneezing, nasal congestion or excess/ purulent secretions, fever, chills, sweats, unintended wt loss, pleuritic or exertional cp, hempoptysis, orthopnea pnd or change in chronic leg swelling. Also denies presyncope, palpitations, heartburn, abdominal pain, nausea, vomiting, diarrhea or change in bowel or urinary habits, dysuria,hematuria, rash, arthralgias, visual complaints, headache, numbness weakness or ataxia.     Objective:   Physical Exam  Gen. Pleasant, well-nourished, in no distress ENT - no  lesions, no post nasal drip Neck: No JVD, no thyromegaly, no carotid bruits Lungs: no use of accessory muscles, no dullness to percussion, clear without rales or rhonchi  Cardiovascular: Rhythm regular, heart sounds  normal, no murmurs or gallops, no peripheral edema Musculoskeletal: No deformities, no cyanosis or clubbing         Assessment & Plan:

## 2015-07-23 NOTE — Assessment & Plan Note (Signed)
Resume singulair

## 2015-07-23 NOTE — Assessment & Plan Note (Signed)
Refills on singulair Stay on  dulera Resumed xolair  Prednisone Rx to pharmacy in case of emergency - discussed plan Call as needed

## 2015-07-23 NOTE — Patient Instructions (Signed)
Refills on singulair Stay on xolair & dulera  Prednisone Rx to pharmacy in case of emergency Call as needed

## 2015-08-14 ENCOUNTER — Telehealth: Payer: Self-pay | Admitting: Pulmonary Disease

## 2015-08-14 NOTE — Telephone Encounter (Signed)
Patient calling to see if Xolair medications has arrived, she would like to pick it up.  She said she took her last shot on Friday.  Tammy Scott - please advise.

## 2015-08-16 NOTE — Telephone Encounter (Signed)
Tammy, please advise. Thanks 

## 2015-08-19 NOTE — Telephone Encounter (Signed)
Tammy please call the pt and let her know if her medication has come in.

## 2015-08-20 NOTE — Telephone Encounter (Signed)
I called [facility], they claim they faxed the form to us for a new rx. Of course the rx hadn't been renewed (we didn't get it.) so I gave a verbal. I also told the pharmacist that she would need this by Fri.. Hopefully we will get it in time. I called the pt. And lm with vmtcb if she had any questions. Sorry it took a few days I've been crazy busy.

## 2015-08-27 ENCOUNTER — Telehealth: Payer: Self-pay | Admitting: Pulmonary Disease

## 2015-08-27 NOTE — Telephone Encounter (Signed)
#   vials:4 Ordered date:08/27/15 Shipping Date:09/02/15 Pharm. Couldn't ship before then because of the holiday. Called pt. And lmovm, I asked to call back if she had any questions.

## 2015-09-03 ENCOUNTER — Telehealth: Payer: Self-pay | Admitting: Pulmonary Disease

## 2015-09-03 NOTE — Telephone Encounter (Signed)
LMTCB for patient  Alexis Duffy is in office; however we need to get confirmation from RA on Wednesday 09-04-15 if it's okay for patient to continue picking up Alexis Duffy vials and getting injections elsewhere.   RA please advise so triage or Tammy Scott in allergy can inform patient. Thanks.

## 2015-09-03 NOTE — Telephone Encounter (Signed)
#   Vials:4 Arrival Date:09/03/15 Lot #:[id number] Exp Date:6/20

## 2015-09-04 NOTE — Telephone Encounter (Signed)
OK as long as she keeps FU appts every 3-4 months

## 2015-09-04 NOTE — Telephone Encounter (Signed)
LMTCB x1 for pt Called and spoke with Dimas Millinammy Scott to make her aware of below. She reports she was under the impression pt is giving these injections to herself and not somewhere else. She wants us to confirm with pt

## 2015-09-05 NOTE — Telephone Encounter (Signed)
Tammy scott, please advise when pt is able to come pick up xolair or is it a process she needs to go through?. thanks

## 2015-09-05 NOTE — Telephone Encounter (Signed)
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

## 2015-09-05 NOTE — Telephone Encounter (Signed)
[phone], pt cb

## 2015-09-05 NOTE — Telephone Encounter (Signed)
lmtcb for pt.  

## 2015-09-05 NOTE — Telephone Encounter (Signed)
Called spoke with pt. She reports she gives her xolair injections to herself. Is this still okay Dr. Vassie LollAlva? thanks

## 2015-09-06 NOTE — Telephone Encounter (Signed)
LMTCB for pt 

## 2015-09-06 NOTE — Telephone Encounter (Signed)
Anytime

## 2015-09-09 NOTE — Telephone Encounter (Signed)
Patient notified that she [facility] Patient says she will pick up her Xolair today. TS notified. Nothing further needed. Closing encounter

## 2015-09-25 ENCOUNTER — Telehealth: Payer: Self-pay

## 2015-09-25 NOTE — Telephone Encounter (Signed)
#   vials:4 Ordered date:09/25/2015 Shipping Date:10/02/2015

## 2015-10-02 NOTE — Telephone Encounter (Signed)
#   Vials:4 Arrival Date:10/02/15 Lot #[id number] Exp Date:6/20

## 2015-10-22 ENCOUNTER — Telehealth: Payer: Self-pay | Admitting: Pulmonary Disease

## 2015-10-22 NOTE — Telephone Encounter (Signed)
#   vials:4 Ordered date:10/22/15 Shipping Date:10/23/15

## 2015-10-24 ENCOUNTER — Ambulatory Visit: Payer: BLUE CROSS/BLUE SHIELD | Admitting: Pulmonary Disease

## 2015-10-24 NOTE — Telephone Encounter (Signed)
#   Vials:4 Arrival Date:10/24/15 Lot #:[id number] Exp Date:6/20

## 2015-10-28 ENCOUNTER — Other Ambulatory Visit: Payer: Self-pay | Admitting: Critical Care Medicine

## 2016-01-30 ENCOUNTER — Telehealth: Payer: Self-pay | Admitting: Pulmonary Disease

## 2016-01-30 NOTE — Telephone Encounter (Signed)
#   vials: 4 Ordered date: 01/30/2016 Shipping Date: 02/04/2016

## 2016-02-04 NOTE — Telephone Encounter (Signed)
#   Vials: 4 Arrival Date: 02/04/2016 Lot #: [id number] Exp Date: 09/2019  Pt is aware that her medication is ready for pick up.

## 2016-02-12 ENCOUNTER — Encounter: Payer: Self-pay | Admitting: Pulmonary Disease

## 2016-02-20 ENCOUNTER — Encounter: Payer: Self-pay | Admitting: Pulmonary Disease

## 2016-02-20 ENCOUNTER — Ambulatory Visit (INDEPENDENT_AMBULATORY_CARE_PROVIDER_SITE_OTHER): Payer: BLUE CROSS/BLUE SHIELD | Admitting: Pulmonary Disease

## 2016-02-20 VITALS — BP 132/82 | HR 83 | Ht 63.0 in | Wt 136.0 lb

## 2016-02-20 DIAGNOSIS — J302 Other seasonal allergic rhinitis: Secondary | ICD-10-CM

## 2016-02-20 DIAGNOSIS — J309 Allergic rhinitis, unspecified: Secondary | ICD-10-CM

## 2016-02-20 DIAGNOSIS — J3089 Other allergic rhinitis: Secondary | ICD-10-CM

## 2016-02-20 DIAGNOSIS — J4551 Severe persistent asthma with (acute) exacerbation: Secondary | ICD-10-CM

## 2016-02-20 NOTE — Progress Notes (Signed)
   Subjective:    Patient ID: Alexis Duffy, female    DOB: 11-08-1970, 45 y.o.   MRN: [id number]  HPI   [age] yoF never smoker, with hx severe persistent atopic asthma with hx multiple positive skin tests.  she works on [employer] at [employer]  History of asthma since age 45 years with triggers recognized to include exercise in cold air, aspirin causing chest tightness, eating mushrooms, seasonal spring and fall pollens. She was  skin tested at [facility] in 2010 with positive reactions to tree pollen, grass pollen, weed pollens, dust mites, cockroach, dog, cat, molds. She was on allergy vaccine for 2-1/2 years but says it made her worse. Had large local reactions. . This got better when she quit allergy vaccine.   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 Follow-up    c/o cough x 2 wks.-thick dk. yellow,had sinus pr.better now,chest congestion,using neb. 2x day,wheezing,mild sob with exertion,low-grade fever 99.2  wks. ago.Chest tightness. Still using Xolair but did not have last wk.   6283m FU  She began Xolair injections since January 2015 with great results   She remains on singulair,  dulera & albuterol prn Last flare in 2015 Seldom albuterol usage, no noct symptoms She reports cough with clear white sputum and attributes this to seasonal allergies  Significant tests/ events  08/2013 Total IgE 473.7  IgG profiles:elevations for beef, milk, lesser reactions to egg, corn.  07/2014 SPiro - ratio 68, FEv1 80% 07/2015 spiro - FEV1 92%, ratio 72  Review of Systems Patient denies significant dyspnea,cough, hemoptysis,  chest pain, palpitations, pedal edema, orthopnea, paroxysmal nocturnal dyspnea, lightheadedness, nausea, vomiting, abdominal or  leg pains      Objective:   Physical Exam  Gen. Pleasant, well-nourished, in no distress ENT - no lesions, no post nasal drip Neck: No JVD, no  thyromegaly, no carotid bruits Lungs: no use of accessory muscles, no dullness to percussion, clear without rales or rhonchi  Cardiovascular: Rhythm regular, heart sounds  normal, no murmurs or gallops, no peripheral edema Musculoskeletal: No deformities, no cyanosis or clubbing        Assessment & Plan:

## 2016-02-20 NOTE — Patient Instructions (Addendum)
Continue on Xolair and Dulera We discussed using Zyrtec seasonally during spring and fall Call as needed

## 2016-02-21 NOTE — Assessment & Plan Note (Signed)
We discussed using Zyrtec seasonally during spring and fall

## 2016-02-21 NOTE — Assessment & Plan Note (Signed)
Continue on Xolair and Dulera We discussed self administration of prednisone and prescription is pending at a pharmacy  Call as needed

## 2016-02-24 ENCOUNTER — Telehealth: Payer: Self-pay | Admitting: Pulmonary Disease

## 2016-02-25 NOTE — Telephone Encounter (Signed)
#   vials:4 Ordered date:02/24/16 Shipping Date:02/28/16

## 2016-03-05 NOTE — Telephone Encounter (Signed)
#   Vials:4 Arrival Date:02/03/16 Lot #[id number] Exp Date:12/20 My clicking finger went spastic, closed encounter too soon. (My bad)

## 2016-03-18 ENCOUNTER — Telehealth: Payer: Self-pay | Admitting: *Deleted

## 2016-03-18 NOTE — Telephone Encounter (Signed)
Called Pt. To let her know her Geoffry Paradisexolair has come in. Lm with vmtcb.. Leaving encounter open in hopes that pt. Will either call or come and pick up meds..Marland Kitchen

## 2016-04-23 ENCOUNTER — Telehealth: Payer: Self-pay | Admitting: Pulmonary Disease

## 2016-04-23 NOTE — Telephone Encounter (Signed)
Called [facility] to let them know she hasn't picked up the last xolair that came in. Dr. Delford FieldWright approved for her to pick her xolair origanally. Dr. Vassie LollAlva agreed to let her cont.. 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  I have called her twice to try to get her to pick it up. Voice mail. Just now tried to reach her about new shipment, voice mail. I asked [facility] to put put her rx on hold they told the pt. Has to do that.

## 2016-04-23 NOTE — Telephone Encounter (Signed)
[facility]. She states that a call was made to set up a delivery time for the patient's xolair. I explained to her that I would send the message [facility]. She voiced understanding and had no further questions.  Will forward to [facility] for follow up

## 2016-04-24 NOTE — Telephone Encounter (Signed)
Called pt. 04/23/16 to let her know [facility] had called wanting to ship xolair. She hasn't picked up her last 2 doses. I had to leave another vm pretty much pleading with her to come in or call. Hopefully she or her daughter will show up.

## 2016-04-27 NOTE — Telephone Encounter (Signed)
[facility] is shipping Xolair with delivery date of 05/07/16. Their records indicate pt is overdue for shipment. They cannot hold rx as pt has to contact them to cancel rx/delivery.   LVM for pt that she needs to contact [facility] if she is wanting to hold/postpone delivery.   Pt is not picking up Xolair at scheduled times, which would indicate that she is not getting her injections as scheduled. This could pose a risk if she misses several injections and then restarts on her own.   Routing to Dr Vassie Loll as Lorain Childes.

## 2016-04-27 NOTE — Telephone Encounter (Signed)
Will wait for patient's response.

## 2016-04-27 NOTE — Telephone Encounter (Signed)
Pt. Came to pick up xolair Fri.. Nothing further needed. Closing.

## 2016-09-30 DIAGNOSIS — J45909 Unspecified asthma, uncomplicated: Secondary | ICD-10-CM | POA: Diagnosis not present

## 2016-10-19 ENCOUNTER — Encounter: Payer: Self-pay | Admitting: Acute Care

## 2016-10-19 ENCOUNTER — Ambulatory Visit (INDEPENDENT_AMBULATORY_CARE_PROVIDER_SITE_OTHER)
Admission: RE | Admit: 2016-10-19 | Discharge: 2016-10-19 | Disposition: A | Discharge status: Home / Self Care | Payer: BLUE CROSS/BLUE SHIELD | Source: Ambulatory Visit | Attending: Acute Care | Admitting: Acute Care

## 2016-10-19 ENCOUNTER — Ambulatory Visit (INDEPENDENT_AMBULATORY_CARE_PROVIDER_SITE_OTHER): Payer: BLUE CROSS/BLUE SHIELD | Admitting: Acute Care

## 2016-10-19 ENCOUNTER — Telehealth: Payer: Self-pay | Admitting: Pulmonary Disease

## 2016-10-19 VITALS — BP 118/72 | HR 80 | Ht 63.0 in

## 2016-10-19 DIAGNOSIS — J4551 Severe persistent asthma with (acute) exacerbation: Secondary | ICD-10-CM | POA: Diagnosis not present

## 2016-10-19 DIAGNOSIS — J40 Bronchitis, not specified as acute or chronic: Secondary | ICD-10-CM | POA: Diagnosis not present

## 2016-10-19 LAB — NITRIC OXIDE: Other: 161

## 2016-10-19 MED ORDER — LEVOFLOXACIN 500 MG PO TABS: 500.0000 mg | ORAL_TABLET | Freq: Every day | ORAL | 0 refills | Status: DC

## 2016-10-19 MED ORDER — ALBUTEROL SULFATE (2.5 MG/3ML) 0.083% IN NEBU: 2.5000 mg | INHALATION_SOLUTION | RESPIRATORY_TRACT | 6 refills | Status: DC | PRN

## 2016-10-19 MED ORDER — PREDNISONE 10 MG PO TABS: ORAL_TABLET | ORAL | 0 refills | Status: DC

## 2016-10-19 MED ORDER — ALBUTEROL SULFATE (2.5 MG/3ML) 0.083% IN NEBU
2.5000 mg | INHALATION_SOLUTION | Freq: Once | RESPIRATORY_TRACT | Status: AC
  Administered 2016-10-19: 2.5 mg via RESPIRATORY_TRACT

## 2016-10-19 MED ORDER — ALBUTEROL SULFATE HFA 108 (90 BASE) MCG/ACT IN AERS: 2.0000 | INHALATION_SPRAY | RESPIRATORY_TRACT | 4 refills | Status: DC | PRN

## 2016-10-19 MED ORDER — METHYLPREDNISOLONE ACETATE 80 MG/ML IJ SUSP
80.0000 mg | Freq: Once | INTRAMUSCULAR | Status: AC
  Administered 2016-10-19: 80 mg via INTRAMUSCULAR

## 2016-10-19 NOTE — Patient Instructions (Addendum)
It is good to see you today.  We will refill your albuterol neb treatments for today. Refill rescue inhaler prescription today. We will check a CXR today Sputum Culture today FENO Neb treatment in the office today. Levaquin 500 mg x 10 days Probiotic daily with antibiotic. Please call the office with any diarrhea that is more than your normal on antibiotics. Prednisone taper; Start tomorrow 10/20/2016 10 mg tablets: 4 tabs x 2 days, 3 tabs x 2 days, 2 tabs x 2 days 1 tab x 2 days then stop. DepoMedrol 80 mg today. Add Protonix daily Add Pepcid at bedtime  Follow up in 2 weeks with Dr. Vassie LollAlva or Kandice RobinsonsSarah Groce, NP Please contact office for sooner follow up if symptoms do not improve or worsen or seek emergency care

## 2016-10-19 NOTE — Telephone Encounter (Signed)
lmomtcb x1 

## 2016-10-19 NOTE — Progress Notes (Signed)
History of Present Illness Alexis Duffy is a 46 y.o. female never smoker with history of severe persistent atopic asthma with history of multiple positive skin tests. 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 She is followed by Dr. Vassie Loll.  Synopsis: History of asthma since age 20 years with triggers recognized to include exercise in cold air, aspirin causing chest tightness, eating mushrooms, seasonal spring and fall pollens. She was  skin tested at [facility] in 2010 with positive reactions to tree pollen, grass pollen, weed pollens, dust mites, cockroach, dog, cat, molds. She was on allergy vaccine for 2-1/2 years but says it made her worse. Had large local reactions. . This got better when she quit allergy vaccine.   10/19/2016  Acute OV for Pt presents to the office with SOB and cough x 3 weeks. She was treated with with Levaquin and prednisone by her PCP on 09/30/16.She  completed the Levaquin 10/16/2016 and prednisone 10/18/2016. She is coughing up thick yellow secretions. She has been off track with her Xolair x 2 months, and has not used it in 2 months.She is using her nebulizer every 4 hours for shortness of breath and  Wheezing, chest tightness.She has had a low grade temp of 99.5-99.6. She is coughing up thick yellow sputum.She notes she has worsening cough going from warm to cold environment. She notes cough worsens with lying down.She knows she is not to take her Xolair until she is well. FENO in the office today was 161.We will repeat Levaquin with Pred taper in addition we will obtain a sputum culture, and check CXR . Goal is to get her well enough to restart her Xolair injections. Xolair has not shipped, we are initiating a follow up within the office regarding shipment of her medication to ensure she has it available when well enough to resume injections..  Significant tests/ events  08/2013 Total IgE 473.7    IgG profiles:elevations for beef, milk, lesser reactions to egg, corn.  07/2014 SPiro - ratio 68, FEv1 80% 07/2015 spiro - FEV1 92%, ratio 72  FENO 10/19/2016>>161   Past medical hx Past Medical History:  Diagnosis Date  . ADHD (attention deficit hyperactivity disorder)   . Allergic rhinitis   . Asthma   . Hypothyroid   . Low testosterone      Past surgical hx, Family hx, Social hx all reviewed.  Current Outpatient Prescriptions on File Prior to Visit  Medication Sig  . ALPRAZolam (XANAX) 0.5 MG tablet Take 0.5 mg by mouth 2 (two) times daily as needed for anxiety.  Marland Kitchen amphetamine-dextroamphetamine (ADDERALL) 20 MG tablet Take 20 mg by mouth 3 (three) times daily.  Marland Kitchen EPINEPHrine 0.3 mg/0.3 mL IJ SOAJ injection Inject 0.3 mLs (0.3 mg total) into the muscle once.  . mometasone-formoterol (DULERA) 200-5 MCG/ACT AERO Inhale 2 puffs into the lungs 2 (two) times daily.  . montelukast (SINGULAIR) 10 MG tablet Take 1 tablet (10 mg total) by mouth at bedtime.  Marland Kitchen PROAIR HFA 108 (90 Base) MCG/ACT inhaler INHALE TWO PUFFS INTO LUNGS EVERY 4 HOURS AS NEEDED FOR WHEEZING OR SHORTNESS OF BREATH  . XOLAIR 150 MG injection INJECT 300 MG UNDER THE SKIN EVERY 2 WEEKS   No current facility-administered medications on file prior to visit.      Allergies  Allergen Reactions  . Asa [Aspirin]     Increased SOB    Review Of Systems:  Constitutional:   No  weight loss, night sweats,  +Fevers, chills, fatigue, or  lassitude.  HEENT:   No headaches,  Difficulty swallowing,  Tooth/dental problems, or  Sore throat,                No sneezing, itching, ear ache, nasal congestion, post nasal drip,   CV:  No chest pain,  Orthopnea, PND, swelling in lower extremities, anasarca, dizziness, palpitations, syncope.   GI  No heartburn, indigestion, abdominal pain, nausea, vomiting, diarrhea, change in bowel habits, loss of appetite, bloody stools.   Resp: + shortness of breath with exertion or at rest.   + excess mucus, + productive cough,  + non-productive cough,  No coughing up of blood.  + change in color of mucus.  + wheezing.  No chest wall deformity  Skin: no rash or lesions.  GU: no dysuria, change in color of urine, no urgency or frequency.  No flank pain, no hematuria   MS:  No joint pain or swelling.  No decreased range of motion.  No back pain.  Psych:  No change in mood or affect. No depression or anxiety.  No memory loss.   Vital Signs BP 118/72 (BP Location: Left Arm, Patient Position: Sitting, Cuff Size: Normal)   Pulse 80   Ht 5\' 3"  (1.6 m)   SpO2 100%    Physical Exam:  General- No distress,  A&Ox3, pleasant ENT: No sinus tenderness, TM clear, pale nasal mucosa, no oral exudate,no post nasal drip, no LAN Cardiac: S1, S2, regular rate and rhythm, no murmur Chest: + wheeze/no  rales/ dullness; no accessory muscle use, no nasal flaring, no sternal retractions Abd.: Soft Non-tender Ext: No clubbing cyanosis, edema Neuro:  normal strength Skin: No rashes, warm and dry Psych: normal mood and behavior   Assessment/Plan  Severe persistent allergic asthma with acute exacerbation Slow to resolve flare Fever with thick yellow secretions FENO 161 Plan: We will refill your albuterol neb treatments for today. Refill rescue inhaler prescription today. We will check a CXR today Sputum Culture today FENO Neb treatment in the office today. Levaquin 500 mg x 10 days Probiotic daily with antibiotic. Please call the office with any diarrhea that is more than your normal on antibiotics. Prednisone taper; Start tomorrow 10/20/2016 10 mg tablets: 4 tabs x 2 days, 3 tabs x 2 days, 2 tabs x 2 days 1 tab x 2 days then stop. DepoMedrol 80 mg today. Add Protonix daily Add Pepcid at bedtime  Follow up in 2 weeks with Dr. Vassie LollAlva or Kandice RobinsonsSarah Demetries Coia, NP Please contact office for sooner follow up if symptoms do not improve or worsen or seek emergency care       Bevelyn NgoSarah F Breleigh Carpino,  NP 10/19/2016  11:29 AM

## 2016-10-19 NOTE — Assessment & Plan Note (Signed)
Slow to resolve flare Fever with thick yellow secretions FENO 161 Plan: We will refill your albuterol neb treatments for today. Refill rescue inhaler prescription today. We will check a CXR today Sputum Culture today FENO Neb treatment in the office today. Levaquin 500 mg x 10 days Probiotic daily with antibiotic. Please call the office with any diarrhea that is more than your normal on antibiotics. Prednisone taper; Start tomorrow 10/20/2016 10 mg tablets: 4 tabs x 2 days, 3 tabs x 2 days, 2 tabs x 2 days 1 tab x 2 days then stop. DepoMedrol 80 mg today. Add Protonix daily Add Pepcid at bedtime  Follow up in 2 weeks with Dr. Vassie LollAlva or Alexis RobinsonsSarah Carianne Taira, NP Please contact office for sooner follow up if symptoms do not improve or worsen or seek emergency care

## 2016-10-20 DIAGNOSIS — R0602 Shortness of breath: Secondary | ICD-10-CM | POA: Diagnosis not present

## 2016-10-20 DIAGNOSIS — R079 Chest pain, unspecified: Secondary | ICD-10-CM | POA: Diagnosis not present

## 2016-10-20 DIAGNOSIS — R002 Palpitations: Secondary | ICD-10-CM | POA: Diagnosis not present

## 2016-10-20 DIAGNOSIS — R000 Tachycardia, unspecified: Secondary | ICD-10-CM | POA: Diagnosis not present

## 2016-10-20 NOTE — Telephone Encounter (Signed)
lmtcb x2 for pt. 

## 2016-10-23 NOTE — Telephone Encounter (Signed)
lmtcb x3 for pt. 

## 2016-10-25 NOTE — Progress Notes (Signed)
Reviewed & agree with plan  

## 2016-10-26 NOTE — Telephone Encounter (Signed)
lmtcb for pt.   Will forward to Kandice RobinsonsSarah Groce as Lorain ChildesFYI, pt has not answered phone after 4 attempts.

## 2016-10-26 NOTE — Telephone Encounter (Signed)
Have we checked demographics and called family members to have her follow up? Thanks Robynn PaneElise.

## 2016-10-26 NOTE — Telephone Encounter (Signed)
lmtcb for pt's husband to have pt call us back.

## 2016-10-27 NOTE — Telephone Encounter (Signed)
Called and spoke pt. Informed her of the recs to restart Xolair per SG. Pt states she comes in on 1/29 for OV and wishes to have it then. Spoke with Reynaldo MiniumKatie Welchel and was advised pt will need to be restarted.   Will forward to [employer] Scott to help facilitate.

## 2016-11-02 ENCOUNTER — Ambulatory Visit (INDEPENDENT_AMBULATORY_CARE_PROVIDER_SITE_OTHER): Payer: BLUE CROSS/BLUE SHIELD | Admitting: Acute Care

## 2016-11-02 ENCOUNTER — Encounter: Payer: Self-pay | Admitting: Acute Care

## 2016-11-02 VITALS — BP 128/80 | HR 80 | Ht 63.0 in

## 2016-11-02 DIAGNOSIS — J302 Other seasonal allergic rhinitis: Secondary | ICD-10-CM

## 2016-11-02 DIAGNOSIS — J4551 Severe persistent asthma with (acute) exacerbation: Secondary | ICD-10-CM

## 2016-11-02 DIAGNOSIS — J3089 Other allergic rhinitis: Secondary | ICD-10-CM

## 2016-11-02 LAB — NITRIC OXIDE: NITRIC OXIDE: 137

## 2016-11-02 MED ORDER — PREDNISONE 10 MG PO TABS: ORAL_TABLET | ORAL | 0 refills | Status: DC

## 2016-11-02 MED ORDER — LEVALBUTEROL HCL 0.63 MG/3ML IN NEBU: 0.6300 mg | INHALATION_SOLUTION | RESPIRATORY_TRACT | 3 refills | Status: DC | PRN

## 2016-11-02 NOTE — Assessment & Plan Note (Signed)
Zyrtec seasonally spring and fall.

## 2016-11-02 NOTE — Assessment & Plan Note (Signed)
Slow to resolve asthma exacerbation No fever, purulent secretions. Continued FENO of 137 today Xolair therapy needs to be resumed. Plan: We will order Xopenex neb treatments as they are less cardiac stimulating. Use the Xopenex once in the morning and once in the evening as you have been doing with the albuterol nebs.. Continue your Elwin SleightDulera twice daily as you have been doing. Rinse mouth after use. Will need to resume  Xolair treatments . Allergy Lab to co-ordinate getting medication to patient. FENO  Today ( 137) Prednisone taper; 10 mg tablets: 4 tabs x 2 days, 3 tabs x 2 days, 2 tabs x 2 days 1 tab x 2 days then stop. Follow up with Vassie LollAlva or Kandice RobinsonsSarah Groce, NP for follow up in 1 months. Please contact office for sooner follow up if symptoms do not improve or worsen or seek emergency care

## 2016-11-02 NOTE — Progress Notes (Signed)
History of Present Illness Alexis Duffy is a 46 y.o. female never smoker with history of severe persistent atopic asthma with history of multiple positive skin tests. 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 She is followed by Dr. Vassie LollAlva.   11/02/2016 Follow up of acute asthma exacerbation: Pt. Presents today for follow up of acute asthma exacerbation. She was treated with Levaquin and a prednisone taper 10/19/2016. Marland Kitchen.She has had a subsequent ED visit 10/19/2016 for SVT, which did resolve after she was started on lopressor. All other labs were normal. She follows up with cardiology 11/10/2016. She is currently continuing on the Lopressor 12.5 mg daily.She is compliant with her Dulera 2 puffs twice daily. She is using her albuterol nebs twice daily.She has noticed improvement in her cough at night with addition of reflux medication. She denies chest pain, fever, orthopnea or hemoptysis.She does still have a cough. She needs to be re-started on her Xolair. Per the patient it has been ordered and needs to be shipped. I have asked that the allergy lab follow up on the delivery notification to the patient so she knows when to come and pick it up.  Tests 11/02/2016 FENO:137  Past medical hx Past Medical History:  Diagnosis Date  . ADHD (attention deficit hyperactivity disorder)   . Allergic rhinitis   . Asthma   . Hypothyroid   . Low testosterone      Past surgical hx, Family hx, Social hx all reviewed.  Current Outpatient Prescriptions on File Prior to Visit  Medication Sig  . albuterol (PROVENTIL HFA;VENTOLIN HFA) 108 (90 Base) MCG/ACT inhaler Inhale 2 puffs into the lungs every 4 (four) hours as needed for wheezing or shortness of breath.  Marland Kitchen. albuterol (PROVENTIL) (2.5 MG/3ML) 0.083% nebulizer solution Take 3 mLs (2.5 mg total) by nebulization every 4 (four) hours as needed for wheezing or shortness of breath.  . ALPRAZolam (XANAX) 0.5 MG  tablet Take 0.5 mg by mouth 2 (two) times daily as needed for anxiety.  Marland Kitchen. amphetamine-dextroamphetamine (ADDERALL) 20 MG tablet Take 20 mg by mouth 3 (three) times daily.  Marland Kitchen. EPINEPHrine 0.3 mg/0.3 mL IJ SOAJ injection Inject 0.3 mLs (0.3 mg total) into the muscle once.  . mometasone-formoterol (DULERA) 200-5 MCG/ACT AERO Inhale 2 puffs into the lungs 2 (two) times daily.  . montelukast (SINGULAIR) 10 MG tablet Take 1 tablet (10 mg total) by mouth at bedtime.  Marland Kitchen. PROAIR HFA 108 (90 Base) MCG/ACT inhaler INHALE TWO PUFFS INTO LUNGS EVERY 4 HOURS AS NEEDED FOR WHEEZING OR SHORTNESS OF BREATH  . XOLAIR 150 MG injection INJECT 300 MG UNDER THE SKIN EVERY 2 WEEKS   No current facility-administered medications on file prior to visit.      Allergies  Allergen Reactions  . Asa [Aspirin]     Increased SOB    Review Of Systems:  Constitutional:   No  weight loss, night sweats,  Fevers, chills, fatigue, or  lassitude.  HEENT:   No headaches,  Difficulty swallowing,  Tooth/dental problems, or  Sore throat,                No sneezing, itching, ear ache, nasal congestion, post nasal drip,   CV:  No chest pain,  Orthopnea, PND, swelling in lower extremities, anasarca, dizziness, +palpitations, syncope.   GI  No heartburn, indigestion, abdominal pain, nausea, vomiting, diarrhea, change in bowel habits, loss of appetite, bloody stools.   Resp:  Mild  shortness of breath with exertion not  at rest.  No excess mucus, no productive cough,  + non-productive cough,  No coughing up of blood.  No change in color of mucus.  + wheezing.  No chest wall deformity  Skin: no rash or lesions.  GU: no dysuria, change in color of urine, no urgency or frequency.  No flank pain, no hematuria   MS:  No joint pain or swelling.  No decreased range of motion.  No back pain.  Psych:  No change in mood or affect. No depression or anxiety.  No memory loss.   Vital Signs BP 128/80 (BP Location: Left Arm, Cuff Size:  Normal)   Pulse 80   Ht 5\' 3"  (1.6 m)   LMP 10/13/2016   SpO2 95%    Physical Exam:  General- No distress,  A&Ox3, pleasant, anxious ENT: No sinus tenderness, TM clear, pale nasal mucosa, no oral exudate,no post nasal drip, no LAN Cardiac: S1, S2, regular rate and rhythm, no murmur Chest: + wheeze/ no rales/ dullness; no accessory muscle use, no nasal flaring, no sternal retractions Abd.: Soft Non-tender Ext: No clubbing cyanosis, edema Neuro:  normal strength Skin: No rashes, warm and dry Psych: slightly anxious   Assessment/Plan  Severe persistent allergic asthma with acute exacerbation Slow to resolve asthma exacerbation No fever, purulent secretions. Continued FENO of 137 today Xolair therapy needs to be resumed. Plan: We will order Xopenex neb treatments as they are less cardiac stimulating. Use the Xopenex once in the morning and once in the evening as you have been doing with the albuterol nebs.. Continue your [name] twice daily as you have been doing. Rinse mouth after use. Will need to resume  Xolair treatments . Allergy Lab to co-ordinate getting medication to patient. FENO  Today ( 137) Prednisone taper; 10 mg tablets: 4 tabs x 2 days, 3 tabs x 2 days, 2 tabs x 2 days 1 tab x 2 days then stop. Follow up with Alexis Duffy or Alexis Robinsons, NP for follow up in 1 months. Please contact office for sooner follow up if symptoms do not improve or worsen or seek emergency care     Seasonal and perennial allergic rhinitis Zyrtec seasonally spring and fall.    Alexis Ngo, NP 11/02/2016  4:41 PM

## 2016-11-02 NOTE — Telephone Encounter (Signed)
Mrs. Alexis Duffy's xolair did not come in.

## 2016-11-02 NOTE — Patient Instructions (Addendum)
It is good to see you today. We will order Xopenex neb treatments as they are less cardiac stimulating. Use the Xopenex once in the morning and once in the evening as you have been doing with the albuterol nebs.. Continue your Alexis Duffy twice daily as you have been doing. Rinse mouth after use. We need to get your Xolair treatments restarted. FENO  Today Prednisone taper; 10 mg tablets: 4 tabs x 2 days, 3 tabs x 2 days, 2 tabs x 2 days 1 tab x 2 days then stop. Follow up with Alexis Duffy or Alexis RobinsonsSarah Makaylin Carlo, NP for follow up in 1 months. Please contact office for sooner follow up if symptoms do not improve or worsen or seek emergency care

## 2016-11-02 NOTE — Telephone Encounter (Signed)
Tammy, please advise on update. Thanks.  

## 2016-11-03 NOTE — Telephone Encounter (Signed)
Spoke with Alexis Duffy and Alexis Duffy regarding this- restart paperwork needs to be filled asap and sent back before any Geoffry Paradisexolair will come in for patient.   Forwarding to [employer] Duffy and Alexis Duffy to follow up on.

## 2016-11-03 NOTE — Progress Notes (Signed)
Reviewed & agree with plan  

## 2016-11-03 NOTE — Telephone Encounter (Signed)
[facility] has been faxed. I gave [facility] 11/09/16 for anticipated date of tx. It's a waiting game now.

## 2016-11-04 NOTE — Telephone Encounter (Addendum)
[facility] sent a PAN form, they couldn't find one in her records. Called pt. To let her know I either need a fax # or for her to come in and fill it out. The sooner it's done the sooner she'll get her medicine.

## 2016-11-05 NOTE — Telephone Encounter (Signed)
Pt. Called me back this morning and gave me her fax #. I faxed the PAN paperwork to her but she hasn't faxed me back.

## 2016-11-06 NOTE — Telephone Encounter (Signed)
Pt. Faxed PAN form back to me, I faxed it to [facility]. Hopefully we'll hear from them soon.

## 2016-11-09 NOTE — Telephone Encounter (Signed)
Homero FellersFrank with [facility], [place] is [phone] wanting to verify SMN has correct prescriber, it has Kandice RobinsonsSarah Groce, NP.  If treated by another physician, will need this changed on paperwork.

## 2016-11-10 DIAGNOSIS — J45909 Unspecified asthma, uncomplicated: Secondary | ICD-10-CM | POA: Diagnosis not present

## 2016-11-10 DIAGNOSIS — R002 Palpitations: Secondary | ICD-10-CM | POA: Diagnosis not present

## 2016-11-10 NOTE — Telephone Encounter (Signed)
I called [facility] back, still waiting for fax so I can verify provider.

## 2016-11-16 NOTE — Telephone Encounter (Signed)
Fax was sent, waiting on approval.

## 2016-11-20 NOTE — Telephone Encounter (Signed)
Everything has been taken care of. I ordered her today, pharmacy has to have her premissoin to ship then can call us to set up del..Marland Kitchen

## 2016-11-23 ENCOUNTER — Telehealth: Payer: Self-pay | Admitting: Pulmonary Disease

## 2016-11-23 NOTE — Telephone Encounter (Signed)
[facility] just called, her Geoffry Paradisexolair will be here 11/25/16. I will also document this in xolair smart text. Nothing further needed.

## 2016-11-23 NOTE — Telephone Encounter (Signed)
#   vials:4 Ordered date:11/23/16 Shipping Date:11/24/16

## 2016-11-25 NOTE — Telephone Encounter (Signed)
#   Vials:4 Arrival Date:11/25/16 Lot #:[id number]  Exp Date:9/21

## 2016-12-02 ENCOUNTER — Ambulatory Visit: Payer: Self-pay | Admitting: Acute Care

## 2016-12-09 ENCOUNTER — Telehealth: Payer: Self-pay | Admitting: Pulmonary Disease

## 2016-12-09 NOTE — Telephone Encounter (Signed)
Called pt. Twice maybe three times, on different days, of course. I left messages each time. Waiting for pt. To come and pick-up xolair. Will leave encounter open for now.

## 2016-12-10 NOTE — Telephone Encounter (Signed)
Pt. Was finally able to pick up xolair. Crazy work schedule. Nothing further needed.

## 2017-01-29 DIAGNOSIS — F900 Attention-deficit hyperactivity disorder, predominantly inattentive type: Secondary | ICD-10-CM | POA: Diagnosis not present

## 2017-01-29 DIAGNOSIS — F3342 Major depressive disorder, recurrent, in full remission: Secondary | ICD-10-CM | POA: Diagnosis not present

## 2017-07-21 DIAGNOSIS — Z23 Encounter for immunization: Secondary | ICD-10-CM | POA: Diagnosis not present

## 2017-08-21 DIAGNOSIS — F900 Attention-deficit hyperactivity disorder, predominantly inattentive type: Secondary | ICD-10-CM | POA: Diagnosis not present

## 2017-08-21 DIAGNOSIS — F411 Generalized anxiety disorder: Secondary | ICD-10-CM | POA: Diagnosis not present

## 2017-09-02 IMAGING — DX DG CHEST 2V
2 series · 2 of 2 positions shown · non-contrast
Comparison: None.

CLINICAL DATA: Bronchitis

EXAM:
CHEST  2 VIEW

[chest pa]
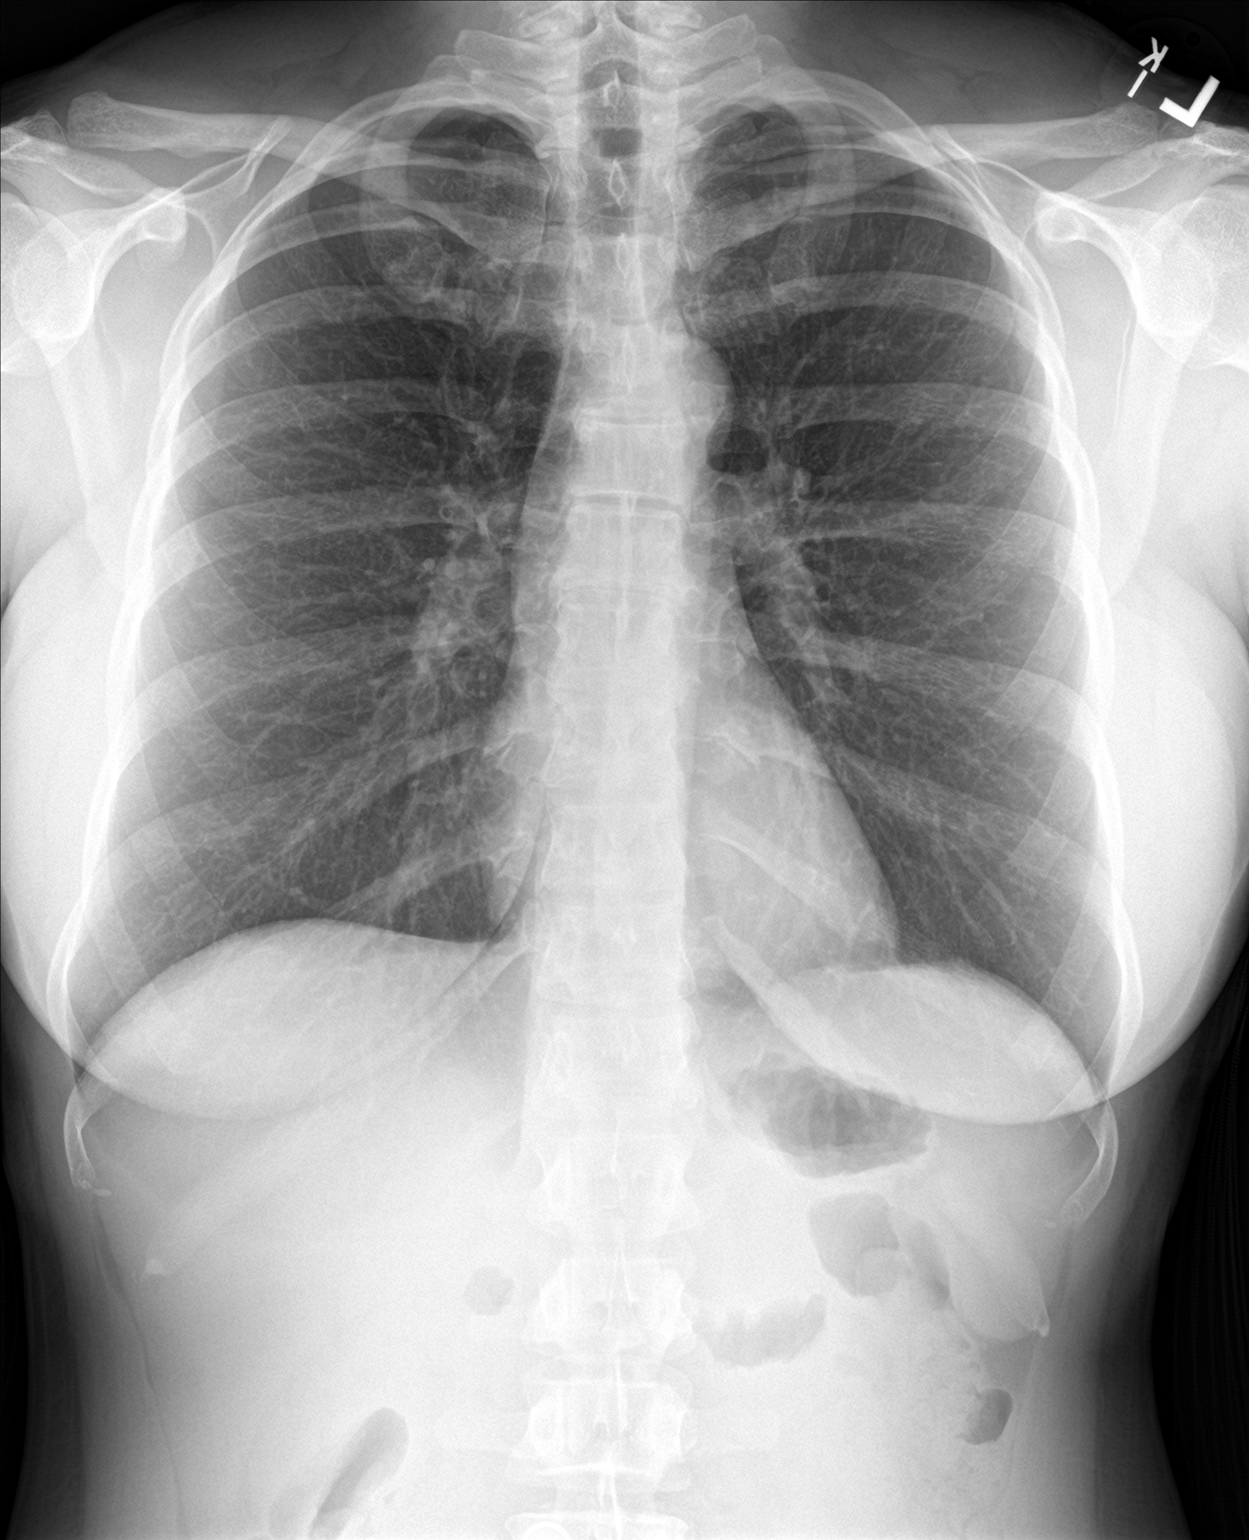

[chest lat]
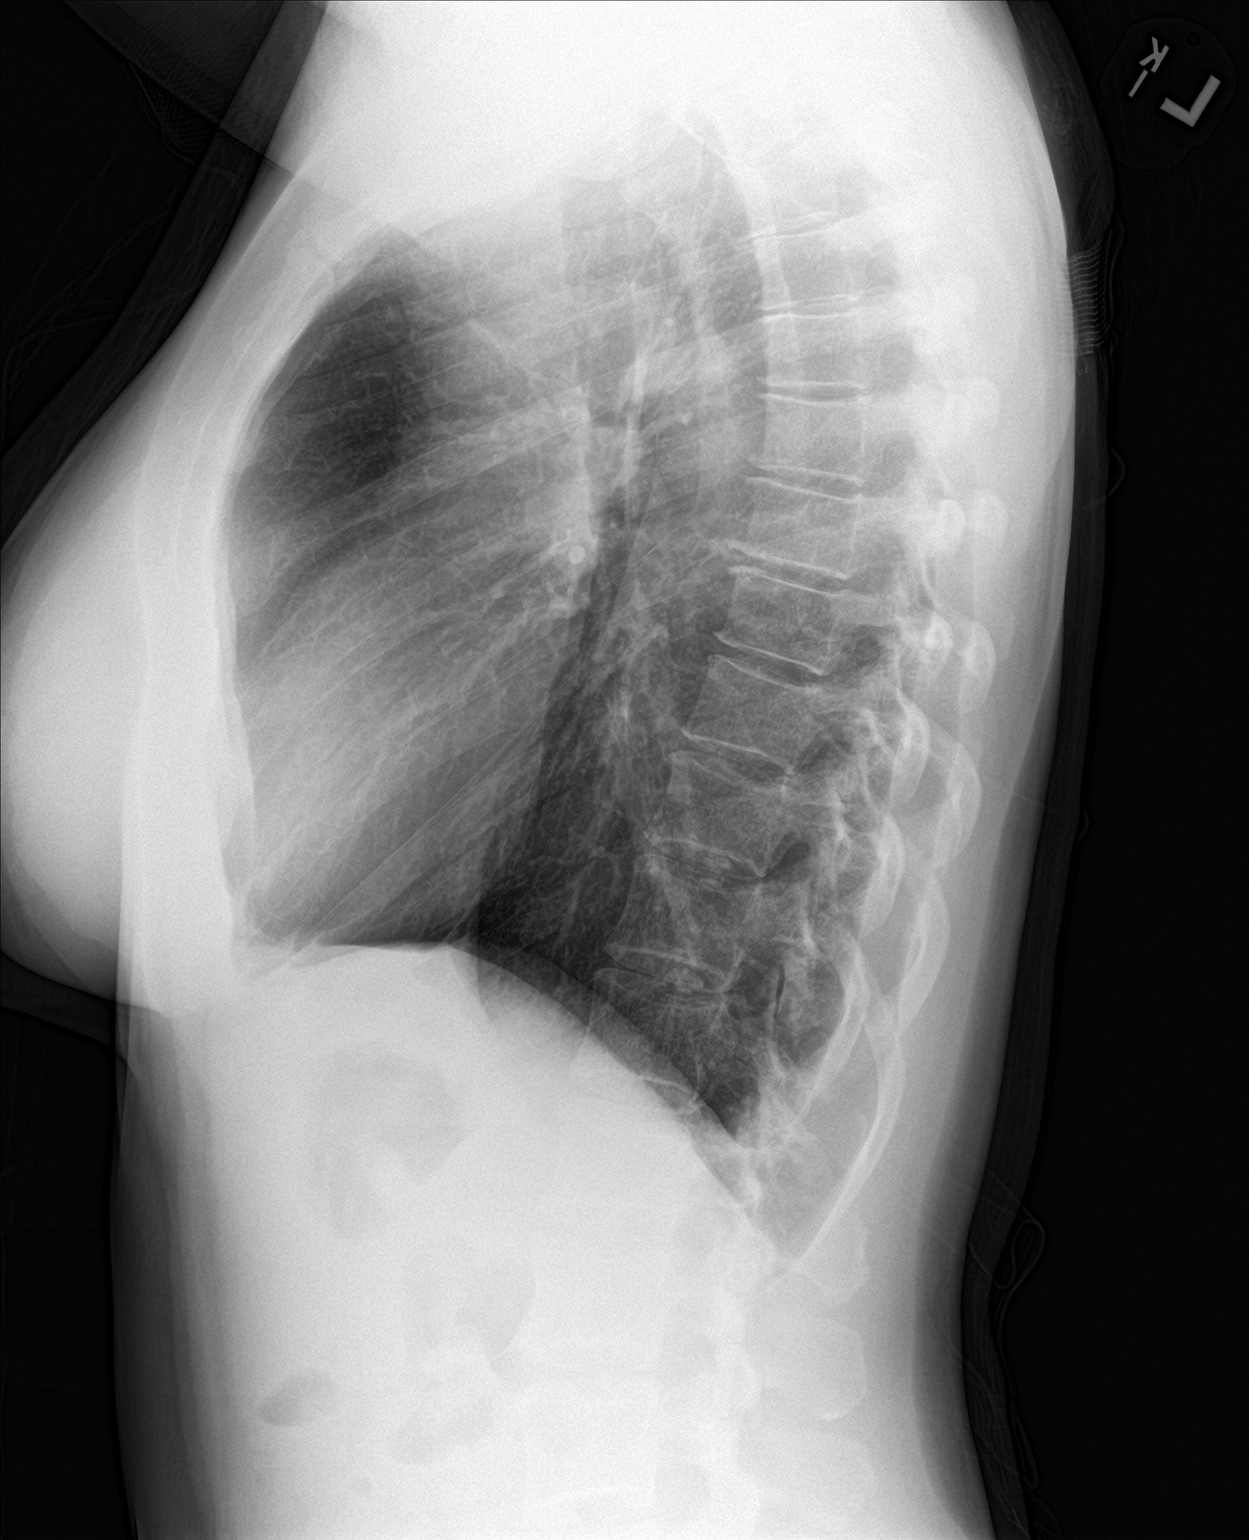

[2 of 2 positions shown; findings below may reference images not displayed]

FINDINGS: The heart size and mediastinal contours are within normal limits.
Both lungs are clear. The visualized skeletal structures are
unremarkable.
IMPRESSION: No active cardiopulmonary disease.

## 2017-09-23 ENCOUNTER — Ambulatory Visit: Payer: BLUE CROSS/BLUE SHIELD | Admitting: Acute Care

## 2017-09-23 ENCOUNTER — Encounter: Payer: Self-pay | Admitting: Acute Care

## 2017-09-23 VITALS — BP 120/80 | HR 68 | Temp 97.8°F

## 2017-09-23 DIAGNOSIS — R059 Cough, unspecified: Secondary | ICD-10-CM

## 2017-09-23 DIAGNOSIS — R05 Cough: Secondary | ICD-10-CM

## 2017-09-23 LAB — NITRIC OXIDE: NITRIC OXIDE: 91

## 2017-09-23 MED ORDER — PREDNISONE 10 MG PO TABS
ORAL_TABLET | ORAL | 0 refills | Status: DC
Start: 2017-09-23 — End: 2017-10-21

## 2017-09-23 MED ORDER — HYDROCODONE-HOMATROPINE 5-1.5 MG/5ML PO SYRP: 5.0000 mL | ORAL_SOLUTION | Freq: Four times a day (QID) | ORAL | 0 refills | Status: DC | PRN

## 2017-09-23 MED ORDER — LEVOFLOXACIN 500 MG PO TABS: 500.0000 mg | ORAL_TABLET | Freq: Every day | ORAL | 0 refills | Status: AC

## 2017-09-23 MED ORDER — METHYLPREDNISOLONE ACETATE 80 MG/ML IJ SUSP
80.0000 mg | Freq: Once | INTRAMUSCULAR | Status: AC
  Administered 2017-09-23: 80 mg via INTRAMUSCULAR

## 2017-09-23 NOTE — Assessment & Plan Note (Signed)
Flare. Has not had Xolair since 05/2017 FENO 91 PPB Plan: We will have Alexis Duffy look into getting your Xolair injections restarted here at the office. FENO today Levaquin 500 mg x 10 days Depo Medrol 80 mg today Start Prednisone taper 09/24/2017: Prednisone taper; 10 mg tablets: 4 tabs x 2 days, 3 tabs x 2 days, 2 tabs x 2 days 1 tab x 2 days then stop. Probiotic with antibiotic. Continue Dulera daily Continue proventil as needed for shortness of breath or wheezing. Flonase for nasal congestion 2 puffs twice daily Nasal saline  Hycodan 5 cc's for cough at bedtime [address] if sleepy Follow up in 2 weeks with Alexis Duffy or Alexis SagoSarah NP Please contact office for sooner follow up if symptoms do not improve or worsen or seek emergency care

## 2017-09-23 NOTE — Patient Instructions (Addendum)
It is good to see you today. We will have [name] look into getting your XolFlorentina Addisonair injections restarted here at the office. FENO today Levaquin 500 mg x 10 days Depo Medrol 80 mg today Start Prednisone taper 09/24/2017: Prednisone taper; 10 mg tablets: 4 tabs x 2 days, 3 tabs x 2 days, 2 tabs x 2 days 1 tab x 2 days then stop. Probiotic with antibiotic. Continue Dulera daily Continue proventil as needed for shortness of breath or wheezing. Flonase for nasal congestion 2 puffs twice daily Nasal saline  Hycodan 5 cc's for cough at bedtime [address] if sleepy Follow up in 2 weeks with Dr. Vassie LollAlva or Maralyn SagoSarah NP Please contact office for sooner follow up if symptoms do not improve or worsen or seek emergency care

## 2017-09-23 NOTE — Progress Notes (Signed)
History of Present Illness Alexis Duffy is a 46 y.o. female never smoker with history of severe persistent atopic asthma with history of multiple positive skin tests. 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 She is followed by Alexis Duffy.     09/23/2017 Acute OV: Pt. Present for an acute OV. She states she is having a flare of her asthma/bronchitis.She took care of her grandaughter last week who was sick with resoiratory illness. She is currently not receiving her Xolair.She has not had a dose  since August 2018 due to insurance reasons, and the fact the patient has had additional family obligations.She states she has had a cough 2-3 weeks with nasal drainage, with sinus nasal pressure. + for post nasal drip. For the last week she has had more nasal drainage and a cough.She is coughing up tan to dark yellow secretions. It has been more productive since yesterday.She had a fever of 99.3 which is 3 degrees above her baseline.She is compliant with her Elwin SleightDulera and she is out of her proventil inhaler. She has Xopenex nebs she was using twice daily.She denies chest pain, orthopnea or hemoptysis  Test Results:  FENO today>> 91 ppb   Past medical hx Past Medical History:  Diagnosis Date  . ADHD (attention deficit hyperactivity disorder)   . Allergic rhinitis   . Asthma   . Hypothyroid   . Low testosterone      Social History   Tobacco Use  . Smoking status: Never Smoker  . Smokeless tobacco: Never Used  Substance Use Topics  . Alcohol use: Yes    Comment: 1-2 drinks per month  . Drug use: No    Alexis Duffy reports that  has never smoked. she has never used smokeless tobacco. She reports that she drinks alcohol. She reports that she does not use drugs.  Tobacco Cessation: Never smoker  Past surgical hx, Family hx, Social hx all reviewed.  Current Outpatient Medications on File Prior to Visit  Medication Sig  . albuterol  (PROVENTIL HFA;VENTOLIN HFA) 108 (90 Base) MCG/ACT inhaler Inhale 2 puffs into the lungs every 4 (four) hours as needed for wheezing or shortness of breath.  Marland Kitchen. albuterol (PROVENTIL) (2.5 MG/3ML) 0.083% nebulizer solution Take 3 mLs (2.5 mg total) by nebulization every 4 (four) hours as needed for wheezing or shortness of breath.  . ALPRAZolam (XANAX) 0.5 MG tablet Take 0.5 mg by mouth 2 (two) times daily as needed for anxiety.  Marland Kitchen. amphetamine-dextroamphetamine (ADDERALL) 20 MG tablet Take 20 mg by mouth 3 (three) times daily.  Marland Kitchen. EPINEPHrine 0.3 mg/0.3 mL IJ SOAJ injection Inject 0.3 mLs (0.3 mg total) into the muscle once.  . levalbuterol (XOPENEX) 0.63 MG/3ML nebulizer solution Take 3 mLs (0.63 mg total) by nebulization every 4 (four) hours as needed for wheezing or shortness of breath.  . metoprolol tartrate (LOPRESSOR) 25 MG tablet Take 12.5 mg by mouth 2 (two) times daily.  . mometasone-formoterol (DULERA) 200-5 MCG/ACT AERO Inhale 2 puffs into the lungs 2 (two) times daily.  . montelukast (SINGULAIR) 10 MG tablet Take 1 tablet (10 mg total) by mouth at bedtime.  . predniSONE (DELTASONE) 10 MG tablet Take 4 tabs for 2 days, then 3 tabs for 2 days, 2 tabs for 2 days, then 1 tab for 2 days, then stop.  Marland Kitchen. PROAIR HFA 108 (90 Base) MCG/ACT inhaler INHALE TWO PUFFS INTO LUNGS EVERY 4 HOURS AS NEEDED FOR WHEEZING OR  SHORTNESS OF BREATH  . XOLAIR 150 MG injection INJECT 300 MG UNDER THE SKIN EVERY 2 WEEKS   No current facility-administered medications on file prior to visit.      Allergies  Allergen Reactions  . Asa [Aspirin]     Increased SOB    Review Of Systems:  Constitutional:   No  weight loss, night sweats,  +Fevers, chills, fatigue, or  lassitude.  HEENT:   No headaches,  Difficulty swallowing,  Tooth/dental problems, or  Sore throat,                No sneezing, itching, ear ache,+ nasal congestion, +post nasal drip,   CV:  No chest pain,  Orthopnea, PND, swelling in lower  extremities, anasarca, dizziness, palpitations, syncope.   GI  No heartburn, indigestion, abdominal pain, nausea, vomiting, diarrhea, change in bowel habits, loss of appetite, bloody stools.   Resp: No shortness of breath with exertion or at rest.  + excess mucus, + productive cough,  No non-productive cough,  No coughing up of blood.  + change in color of mucus.  + wheezing.  No chest wall deformity  Skin: no rash or lesions.  GU: no dysuria, change in color of urine, no urgency or frequency.  No flank pain, no hematuria   MS:  No joint pain or swelling.  No decreased range of motion.  No back pain.  Psych:  No change in mood or affect. No depression or anxiety.  No memory loss.   Vital Signs BP 120/80 (BP Location: Left Arm, Cuff Size: Normal)   Pulse 68   Temp 97.8 F (36.6 C) (Oral)   SpO2 99%    Physical Exam:  General- No distress,  A&Ox3, pleasant, hoarse voice ENT: No sinus tenderness, TM clear, pale nasal mucosa, no oral exudate,+ post nasal drip, no LAN Cardiac: S1, S2, regular rate and rhythm, no murmur Chest: + wheeze/ no rales/ dullness; no accessory muscle use, no nasal flaring, no sternal retractions Abd.: Soft Non-tender, non-distended Ext: No clubbing cyanosis, edema Neuro:  normal strength Skin: No rashes, warm and dry Psych: normal mood and behavior   Assessment/Plan  Severe persistent allergic asthma with acute exacerbation Flare. Has not had Xolair since 05/2017 FENO 91 PPB Plan: We will have Alexis Duffy look into getting your Xolair injections restarted here at the office. FENO today Levaquin 500 mg x 10 days Depo Medrol 80 mg today Start Prednisone taper 09/24/2017: Prednisone taper; 10 mg tablets: 4 tabs x 2 days, 3 tabs x 2 days, 2 tabs x 2 days 1 tab x 2 days then stop. Probiotic with antibiotic. Continue Dulera daily Continue proventil as needed for shortness of breath or wheezing. Flonase for nasal congestion 2 puffs twice daily Nasal saline    Hycodan 5 cc's for cough at bedtime [address] if sleepy Follow up in 2 weeks with Alexis Duffy or Maralyn SagoSarah NP Please contact office for sooner follow up if symptoms do not improve or worsen or seek emergency care         Bevelyn NgoSarah F Shyheem Whitham, NP 09/23/2017  12:20 PM

## 2017-10-14 ENCOUNTER — Ambulatory Visit: Payer: Self-pay | Admitting: Acute Care

## 2017-10-21 ENCOUNTER — Encounter: Payer: Self-pay | Admitting: Acute Care

## 2017-10-21 ENCOUNTER — Ambulatory Visit: Payer: BLUE CROSS/BLUE SHIELD | Admitting: Acute Care

## 2017-10-21 VITALS — BP 132/86 | HR 99 | Ht 63.0 in

## 2017-10-21 DIAGNOSIS — K219 Gastro-esophageal reflux disease without esophagitis: Secondary | ICD-10-CM | POA: Diagnosis not present

## 2017-10-21 DIAGNOSIS — R05 Cough: Secondary | ICD-10-CM | POA: Diagnosis not present

## 2017-10-21 DIAGNOSIS — J4551 Severe persistent asthma with (acute) exacerbation: Secondary | ICD-10-CM | POA: Diagnosis not present

## 2017-10-21 DIAGNOSIS — R059 Cough, unspecified: Secondary | ICD-10-CM

## 2017-10-21 LAB — NITRIC OXIDE: Nitric Oxide: 56

## 2017-10-21 MED ORDER — HYDROCODONE-HOMATROPINE 5-1.5 MG/5ML PO SYRP: 5.0000 mL | ORAL_SOLUTION | Freq: Four times a day (QID) | ORAL | 0 refills | Status: DC | PRN

## 2017-10-21 MED ORDER — OMEPRAZOLE 20 MG PO CPDR: 20.0000 mg | DELAYED_RELEASE_CAPSULE | Freq: Every day | ORAL | 3 refills | Status: DC

## 2017-10-21 MED ORDER — FAMOTIDINE 20 MG PO TABS: 20.0000 mg | ORAL_TABLET | Freq: Two times a day (BID) | ORAL | 3 refills | Status: AC

## 2017-10-21 MED ORDER — PREDNISONE 10 MG PO TABS: ORAL_TABLET | ORAL | 0 refills | Status: DC

## 2017-10-21 NOTE — Patient Instructions (Addendum)
FENO today>> 56 ppb  Prednisone taper; 10 mg tablets: 4 tabs x 2 days, 3 tabs x 2 days, 2 tabs x 2 days 1 tab x 2 days then stop. Prilosec 20 mg in the morning Pepcid at bedtime Avoid menthol, mint and chocolate  Follow up in 4 weeks to ensure better. I have spoken with injection clinic about getting your Xolair asap Continue Dulera daily Continue proventil as needed for shortness of breath or wheezing. Flonase for nasal congestion 2 puffs twice daily Nasal saline  Hycodan 5 cc's for cough at bedtime [address] if sleepy Follow up in 2 weeks with Dr. Vassie LollAlva or Maralyn SagoSarah NP Please contact office for sooner follow up if symptoms do not improve or worsen or seek emergency care

## 2017-10-21 NOTE — Progress Notes (Signed)
History of Present Illness Alexis Duffy is a 47 y.o. female never smoker  with history of severe persistent atopic asthma with history of multiple positive skin tests. 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 She had a lapse in therapy 05/2017. She is followed by Alexis Duffy.   10/21/2017 Acute OV for cough Pt. Presents for follow up. She was seen 09/23/2017 for flare of her asthma/ bronchitis.FENO at that time was 91 ppb. She had been off her Xolair since 05/2017. She was treated with Levaquin and pred taper. She returns today with recurrent cough after treatment with abx  And  prednisone. She states her cough has not resolved. She had to use her Hycodan cough syrup.She states her cough did not go away with pred taper but did  get better. She states her cough is productive of white sputum. She is using her neb treatments at home twice daily. She denies fever, chest pain or hemoptysis.She is compliant with her Dulera, Singulair. She is using her rescue inhaler and neb treatments twice daily. She states she needs to get therapy with Xolair resumed. She is willing to come to the office for her injections.  Test Results: FENO 56 ppb  Past medical hx Past Medical History:  Diagnosis Date  . ADHD (attention deficit hyperactivity disorder)   . Allergic rhinitis   . Asthma   . Hypothyroid   . Low testosterone      Social History   Tobacco Use  . Smoking status: Never Smoker  . Smokeless tobacco: Never Used  Substance Use Topics  . Alcohol use: Yes    Comment: 1-2 drinks per month  . Drug use: No    Alexis Duffy reports that  has never smoked. she has never used smokeless tobacco. She reports that she drinks alcohol. She reports that she does not use drugs.  Tobacco Cessation: Never smoker  Past surgical hx, Family hx, Social hx all reviewed.  Current Outpatient Medications on File Prior to Visit  Medication Sig  . albuterol  (PROVENTIL HFA;VENTOLIN HFA) 108 (90 Base) MCG/ACT inhaler Inhale 2 puffs into the lungs every 4 (four) hours as needed for wheezing or shortness of breath.  Marland Kitchen albuterol (PROVENTIL) (2.5 MG/3ML) 0.083% nebulizer solution Take 3 mLs (2.5 mg total) by nebulization every 4 (four) hours as needed for wheezing or shortness of breath.  . ALPRAZolam (XANAX) 0.5 MG tablet Take 0.5 mg by mouth 2 (two) times daily as needed for anxiety.  Marland Kitchen amphetamine-dextroamphetamine (ADDERALL) 20 MG tablet Take 20 mg by mouth 3 (three) times daily.  Marland Kitchen EPINEPHrine 0.3 mg/0.3 mL IJ SOAJ injection Inject 0.3 mLs (0.3 mg total) into the muscle once.  . levalbuterol (XOPENEX) 0.63 MG/3ML nebulizer solution Take 3 mLs (0.63 mg total) by nebulization every 4 (four) hours as needed for wheezing or shortness of breath.  . metoprolol tartrate (LOPRESSOR) 25 MG tablet Take 12.5 mg by mouth 2 (two) times daily.  . mometasone-formoterol (DULERA) 200-5 MCG/ACT AERO Inhale 2 puffs into the lungs 2 (two) times daily.  . montelukast (SINGULAIR) 10 MG tablet Take 1 tablet (10 mg total) by mouth at bedtime.  Marland Kitchen PROAIR HFA 108 (90 Base) MCG/ACT inhaler INHALE TWO PUFFS INTO LUNGS EVERY 4 HOURS AS NEEDED FOR WHEEZING OR SHORTNESS OF BREATH  . XOLAIR 150 MG injection INJECT 300 MG UNDER THE SKIN EVERY 2 WEEKS   No current facility-administered medications on file prior to  visit.      Allergies  Allergen Reactions  . Asa [Aspirin]     Increased SOB    Review Of Systems:  Constitutional:   No  weight loss, night sweats,  Fevers, chills, fatigue, or  lassitude.  HEENT:   No headaches,  Difficulty swallowing,  Tooth/dental problems, or  Sore throat,                No sneezing, itching, ear ache, nasal congestion, post nasal drip,   CV:  No chest pain,  Orthopnea, PND, swelling in lower extremities, anasarca, dizziness, palpitations, syncope.   GI  No heartburn, indigestion, abdominal pain, nausea, vomiting, diarrhea, change in bowel  habits, loss of appetite, bloody stools.   Resp: Baseline  shortness of breath with exertion less at rest.  + excess mucus, + productive cough,  No non-productive cough,  No coughing up of blood.  No change in color of mucus.  + wheezing.  No chest wall deformity  Skin: no rash or lesions.  GU: no dysuria, change in color of urine, no urgency or frequency.  No flank pain, no hematuria   MS:  No joint pain or swelling.  No decreased range of motion.  No back pain.  Psych:  No change in mood or affect. No depression or anxiety.  No memory loss.   Vital Signs BP 132/86 (BP Location: Right Arm, Cuff Size: Normal)   Pulse 99   Ht 5\' 3"  (1.6 m)   SpO2 100%   BMI 24.09 kg/m    Physical Exam:  General- No distress,  A&Ox3, pleasant ENT: No sinus tenderness, TM clear, pale nasal mucosa, no oral exudate,no post nasal drip, no LAN Cardiac: S1, S2, regular rate and rhythm, no murmur Chest: + wheeze/ No rales/ dullness; no accessory muscle use, no nasal flaring, no sternal retractions Abd.: Soft Non-tender, BS +, Non-distended Ext: No clubbing cyanosis, edema Neuro:  normal strength Skin: No rashes, warm and dry Psych: normal mood and behavior   Assessment/Plan  Severe persistent allergic asthma with acute exacerbation Repeat Flare Awaiting Xolair to resume therapy ( Has been off therapy since 05/2017) Plan: FENO today>> 56 ppb  Prednisone taper; 10 mg tablets: 4 tabs x 2 days, 3 tabs x 2 days, 2 tabs x 2 days 1 tab x 2 days then stop. Prilosec 20 mg in the morning Pepcid at bedtime Avoid menthol, mint and chocolate  Follow up in 4 weeks to ensure better. I have spoken with injection clinic about getting your Xolair asap Continue Dulera daily Continue proventil as needed for shortness of breath or wheezing. Flonase for nasal congestion 2 puffs twice daily Nasal saline  Hycodan 5 cc's for cough at bedtime Alexis Duffy if sleepy Follow up in 2 weeks with Dr. Vassie LollAlva or Alexis SagoSarah  NP Please contact office for sooner follow up if symptoms do not improve or worsen or seek emergency care      Acid reflux ? If reflux is exacerbating her cough/flare Currently not on any PPI  therapy Plan: Prilosec 20 mg in the morning Pepcid at bedtime     Bevelyn NgoSarah F Hajar Penninger, NP 10/21/2017  4:36 PM

## 2017-10-21 NOTE — Assessment & Plan Note (Addendum)
?   If reflux is exacerbating her cough/flare Currently not on any PPI  therapy Plan: Prilosec 20 mg in the morning Pepcid at bedtime

## 2017-10-21 NOTE — Assessment & Plan Note (Signed)
Repeat Flare Awaiting Xolair to resume therapy ( Has been off therapy since 05/2017) Plan: FENO today>> 56 ppb  Prednisone taper; 10 mg tablets: 4 tabs x 2 days, 3 tabs x 2 days, 2 tabs x 2 days 1 tab x 2 days then stop. Prilosec 20 mg in the morning Pepcid at bedtime Avoid menthol, mint and chocolate  Follow up in 4 weeks to ensure better. I have spoken with injection clinic about getting your Xolair asap Continue Dulera daily Continue proventil as needed for shortness of breath or wheezing. Flonase for nasal congestion 2 puffs twice daily Nasal saline  Hycodan 5 cc's for cough at bedtime [address] if sleepy Follow up in 2 weeks with Dr. Vassie LollAlva or Maralyn SagoSarah NP Please contact office for sooner follow up if symptoms do not improve or worsen or seek emergency care

## 2017-11-09 ENCOUNTER — Telehealth: Payer: Self-pay | Admitting: Acute Care

## 2017-11-09 ENCOUNTER — Ambulatory Visit: Payer: Self-pay | Admitting: Adult Health

## 2017-11-09 NOTE — Telephone Encounter (Signed)
Routing to Tammy Scott for follow-up. 

## 2017-11-11 NOTE — Telephone Encounter (Signed)
Lucy from [facility] is calling about Xolair inj [phone]  [id number]

## 2017-11-15 ENCOUNTER — Telehealth: Payer: Self-pay | Admitting: Acute Care

## 2017-11-15 NOTE — Telephone Encounter (Signed)
#   vials:4 Ordered date:11/15/17 Shipping Date:11/16/17

## 2017-11-15 NOTE — Telephone Encounter (Signed)
Called [facility] back to set up delivery, pt didn't call to give permission to ship so they couldn't ship. Even though I called to let her know to call the pharm and gave her the #. I received a fax from [facility] saying the pt. Had contacted them to call the pharm and set up shipment. I did, they are going to del. 11/17/17. I will put the order in xolair smart text. Nothing further needed.

## 2017-11-16 ENCOUNTER — Telehealth: Payer: Self-pay | Admitting: Acute Care

## 2017-11-16 MED ORDER — EPINEPHRINE 0.3 MG/0.3ML IJ SOAJ: 0.3000 mg | Freq: Once | INTRAMUSCULAR | 0 refills | Status: DC

## 2017-11-17 NOTE — Telephone Encounter (Signed)
Rx sent 

## 2017-11-17 NOTE — Telephone Encounter (Signed)
#   Vials:4 Arrival Date:11/17/2017 Lot #[id number] Exp Date:03/2021

## 2017-11-22 ENCOUNTER — Ambulatory Visit: Payer: Self-pay

## 2017-11-26 ENCOUNTER — Ambulatory Visit (INDEPENDENT_AMBULATORY_CARE_PROVIDER_SITE_OTHER): Payer: BLUE CROSS/BLUE SHIELD

## 2017-11-26 DIAGNOSIS — J4540 Moderate persistent asthma, uncomplicated: Secondary | ICD-10-CM | POA: Diagnosis not present

## 2017-11-29 MED ORDER — OMALIZUMAB 150 MG ~~LOC~~ SOLR
300.0000 mg | Freq: Once | SUBCUTANEOUS | Status: AC
Start: 2017-11-26 — End: 2017-11-26
  Administered 2017-11-26: 300 mg via SUBCUTANEOUS

## 2017-12-10 ENCOUNTER — Ambulatory Visit: Payer: Self-pay

## 2017-12-13 ENCOUNTER — Ambulatory Visit: Payer: Self-pay

## 2017-12-27 ENCOUNTER — Telehealth: Payer: Self-pay | Admitting: Acute Care

## 2017-12-29 NOTE — Telephone Encounter (Signed)
#   vials:4 Ordered date:12/27/17 Shipping Date:12/29/17

## 2017-12-31 NOTE — Telephone Encounter (Signed)
#   Vials:4 Arrival Date:12/30/2017 Lot #[id number] Exp Date:06/2021

## 2018-01-25 DIAGNOSIS — J329 Chronic sinusitis, unspecified: Secondary | ICD-10-CM | POA: Diagnosis not present

## 2018-01-25 DIAGNOSIS — J209 Acute bronchitis, unspecified: Secondary | ICD-10-CM | POA: Diagnosis not present

## 2018-03-22 DIAGNOSIS — R590 Localized enlarged lymph nodes: Secondary | ICD-10-CM | POA: Diagnosis not present

## 2018-03-30 DIAGNOSIS — F900 Attention-deficit hyperactivity disorder, predominantly inattentive type: Secondary | ICD-10-CM | POA: Diagnosis not present

## 2018-03-30 DIAGNOSIS — F411 Generalized anxiety disorder: Secondary | ICD-10-CM | POA: Diagnosis not present

## 2018-07-24 DIAGNOSIS — Z23 Encounter for immunization: Secondary | ICD-10-CM | POA: Diagnosis not present

## 2018-09-10 DIAGNOSIS — F410 Panic disorder [episodic paroxysmal anxiety] without agoraphobia: Secondary | ICD-10-CM | POA: Diagnosis not present

## 2018-09-10 DIAGNOSIS — F3342 Major depressive disorder, recurrent, in full remission: Secondary | ICD-10-CM | POA: Diagnosis not present

## 2018-09-10 DIAGNOSIS — F900 Attention-deficit hyperactivity disorder, predominantly inattentive type: Secondary | ICD-10-CM | POA: Diagnosis not present

## 2018-10-22 DIAGNOSIS — J209 Acute bronchitis, unspecified: Secondary | ICD-10-CM | POA: Diagnosis not present

## 2018-12-23 DIAGNOSIS — H1033 Unspecified acute conjunctivitis, bilateral: Secondary | ICD-10-CM | POA: Diagnosis not present

## 2018-12-28 ENCOUNTER — Encounter: Payer: Self-pay | Admitting: Acute Care

## 2018-12-28 ENCOUNTER — Other Ambulatory Visit: Payer: Self-pay

## 2018-12-28 ENCOUNTER — Ambulatory Visit: Payer: PRIVATE HEALTH INSURANCE | Admitting: Acute Care

## 2018-12-28 DIAGNOSIS — J4551 Severe persistent asthma with (acute) exacerbation: Secondary | ICD-10-CM

## 2018-12-28 DIAGNOSIS — J301 Allergic rhinitis due to pollen: Secondary | ICD-10-CM | POA: Diagnosis not present

## 2018-12-28 MED ORDER — FLUTICASONE PROPIONATE 50 MCG/ACT NA SUSP: 2.0000 | Freq: Every day | NASAL | 5 refills | Status: DC

## 2018-12-28 MED ORDER — MOMETASONE FURO-FORMOTEROL FUM 200-5 MCG/ACT IN AERO: 2.0000 | INHALATION_SPRAY | Freq: Two times a day (BID) | RESPIRATORY_TRACT | 5 refills | Status: DC

## 2018-12-28 MED ORDER — MONTELUKAST SODIUM 10 MG PO TABS: 10.0000 mg | ORAL_TABLET | Freq: Every day | ORAL | 5 refills | Status: DC

## 2018-12-28 MED ORDER — ALBUTEROL SULFATE HFA 108 (90 BASE) MCG/ACT IN AERS: INHALATION_SPRAY | RESPIRATORY_TRACT | 5 refills | Status: DC

## 2018-12-28 MED ORDER — PREDNISONE 10 MG PO TABS: ORAL_TABLET | ORAL | 0 refills | Status: DC

## 2018-12-28 NOTE — Patient Instructions (Addendum)
It is good to see you today. We will renew your Proventil, Dulera, Flonase  and Singulair. We will give you a printed prescription for  Prednisone taper; 10 mg tablets: 4 tabs x 2 days, 3 tabs x 2 days, 2 tabs x 2 days 1 tab x 2 days then stop. Continue Dulera 2 puffs twice  daily  Rinse mouth after use. Continue Proventil as needed for shortness of breath or wheezing. Continue using you Flonase daily Maintain your seasonal allergy regimen Remember to remove shoes prior to going into your house. Shower and change clothes after you go outside. Resume Xolair injections as soon as it is available. Follow up in 4 months with Dr. Vassie Loll or Maralyn Sago, NP If you need [place] sooner call the office  for a virtual visit We will provide you with a high risk letter.   Sips of water instead of throat clearing Sugar Free Coca-Cola or Werther's originals for throat soothing. Delsym Cough syrup 5 cc's every 12 hours Non-sedating antihistamine of your choice daily ( Zyrtec, Allegra, Xyzol, Claritin ( Generic ok)

## 2018-12-28 NOTE — Assessment & Plan Note (Signed)
Stable interval Plan We will renew your Proventil, Dulera, Flonase  and Singulair. We will give you a printed prescription for  Prednisone taper; 10 mg tablets: 4 tabs x 2 days, 3 tabs x 2 days, 2 tabs x 2 days 1 tab x 2 days then stop. Continue Dulera 2 puffs twice  daily  Rinse mouth after use. Continue Proventil as needed for shortness of breath or wheezing. Continue using you Flonase daily Maintain your seasonal allergy regimen Remember to remove shoes prior to going into your house. Shower and change clothes after you go outside. Resume Xolair injections as soon as it is available. Follow up in 4 months with Dr. Vassie Loll or Maralyn Sago, NP If you need [place] sooner call the office  for a virtual visit We will provide you with a high risk letter.   Sips of water instead of throat clearing Sugar Free Coca-Cola or Werther's originals for throat soothing. Delsym Cough syrup 5 cc's every 12 hours Non-sedating antihistamine of your choice daily ( Zyrtec, Allegra, Xyzol, Claritin ( Generic ok)

## 2018-12-28 NOTE — Assessment & Plan Note (Signed)
Maintain regimen We will renew your Proventil, Dulera, Flonase  and Singulair. Continue Dulera 2 puffs twice  daily  Rinse mouth after use. Continue Proventil as needed for shortness of breath or wheezing. Continue using you Flonase daily Maintain your seasonal allergy regimen Remember to remove shoes prior to going into your house. Shower and change clothes after you go outside. Follow up in 4 months with Dr. Vassie Loll or Maralyn Sago, NP If you need [place] sooner call the office  for a virtual visit We will provide you with a high risk letter.

## 2018-12-28 NOTE — Progress Notes (Signed)
History of Present Illness Alexis Duffy is a 48 y.o. female never smoker  with history of severe persistent atopic asthma with history of multiple positive skin tests. 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 She had a lapse in therapy 05/2017. She is followed by Dr. Vassie Loll.   12/28/2018  Pt. Presents for follow up.She was last seen 10/21/2018 for asthma flare. FENO at the time was 91 ppb. She was treated with prednisone taper, told to continue her [facility] daily. She was advised to continue proventil as needed, use Flonase for nasal congestion and nasal saline as needed. She had been off her Xolair since 05/2017. She was treated with Levaquin and pred taper. She was told to follow up in 2 weeks, but she did not come in for follow up. She has been using Hycodan for cough.She is awaiting ok from her insurance for Xolair injections.This is her bad time of year for  flares. She also notices the cold exacerbated her bronchitis.  Pt. Presents today for follow up. She states she has some shortness of breath and chest tightness. She did have a sick exposure through her granddaughter who had a cold. Her symptoms resolve easily with her inhaler.She has been compliant with her Dulera, Singulair  and her Proventil . She states she has had some chest tightness. She states it has been resolving with use of her nebs and inhalers.She is anxious about her underlying asthma in the current COVID environment.She needs Proventil and Dulera refills. She is asking for a prescription for prednisone taper to use in case she has a flare on an acute basis and cannot get in to see an MD. I have encouraged her to use the tele visit options for quick access to medical care.She is recovering from pink eye. She has been treated with optic antibiotics. She denies any fever, chest pain, orthopnea or hemoptysis.  Test Results:  08/2013 Total IgE 473.7  IgG profiles:elevations for beef,  milk, lesser reactions to egg, corn.  07/2014 SPiro - ratio 68, FEv1 80% 07/2015 spiro - FEV1 92%, ratio 72  FENO 10/19/2016>>161  No flowsheet data found.  No flowsheet data found.  BNP No results found for: BNP  ProBNP No results found for: PROBNP  PFT No results found for: FEV1PRE, FEV1POST, FVCPRE, FVCPOST, TLC, DLCOUNC, PREFEV1FVCRT, PSTFEV1FVCRT  No results found.   Past medical hx Past Medical History:  Diagnosis Date  . ADHD (attention deficit hyperactivity disorder)   . Allergic rhinitis   . Asthma   . Hypothyroid   . Low testosterone      Social History   Tobacco Use  . Smoking status: Never Smoker  . Smokeless tobacco: Never Used  Substance Use Topics  . Alcohol use: Yes    Comment: 1-2 drinks per month  . Drug use: No    Ms.Maragh reports that she has never smoked. She has never used smokeless tobacco. She reports current alcohol use. She reports that she does not use drugs.  Tobacco Cessation: Never smoker  Past surgical hx, Family hx, Social hx all reviewed.  Current Outpatient Medications on File Prior to Visit  Medication Sig  . albuterol (PROVENTIL HFA;VENTOLIN HFA) 108 (90 Base) MCG/ACT inhaler Inhale 2 puffs into the lungs every 4 (four) hours as needed for wheezing or shortness of breath.  Marland Kitchen albuterol (PROVENTIL) (2.5 MG/3ML) 0.083% nebulizer solution Take 3 mLs (2.5 mg total) by nebulization every 4 (four) hours  as needed for wheezing or shortness of breath.  . ALPRAZolam (XANAX) 0.5 MG tablet Take 0.5 mg by mouth 2 (two) times daily as needed for anxiety.  Marland Kitchen amphetamine-dextroamphetamine (ADDERALL) 20 MG tablet Take 20 mg by mouth 3 (three) times daily.  . famotidine (PEPCID) 20 MG tablet Take 1 tablet (20 mg total) by mouth 2 (two) times daily.  Marland Kitchen levalbuterol (XOPENEX) 0.63 MG/3ML nebulizer solution Take 3 mLs (0.63 mg total) by nebulization every 4 (four) hours as needed for wheezing or shortness of breath.  . metoprolol tartrate  (LOPRESSOR) 25 MG tablet Take 12.5 mg by mouth 2 (two) times daily.  . mometasone-formoterol (DULERA) 200-5 MCG/ACT AERO Inhale 2 puffs into the lungs 2 (two) times daily.  . montelukast (SINGULAIR) 10 MG tablet Take 1 tablet (10 mg total) by mouth at bedtime.  Marland Kitchen omeprazole (PRILOSEC) 20 MG capsule Take 1 capsule (20 mg total) by mouth daily.  Marland Kitchen PROAIR HFA 108 (90 Base) MCG/ACT inhaler INHALE TWO PUFFS INTO LUNGS EVERY 4 HOURS AS NEEDED FOR WHEEZING OR SHORTNESS OF BREATH  . XOLAIR 150 MG injection INJECT 300 MG UNDER THE SKIN EVERY 2 WEEKS  . EPINEPHrine 0.3 mg/0.3 mL IJ SOAJ injection Inject 0.3 mLs (0.3 mg total) into the muscle once for 1 dose.   No current facility-administered medications on file prior to visit.      Allergies  Allergen Reactions  . Asa [Aspirin]     Increased SOB    Review Of Systems:  Constitutional:   No  weight loss, night sweats,  Fevers, chills, fatigue, or  lassitude.  HEENT:   No headaches,  Difficulty swallowing,  Tooth/dental problems, or  Sore throat,                No sneezing, itching, ear ache, + nasal congestion, post nasal drip,   CV:  No chest pain,  Orthopnea, PND, swelling in lower extremities, anasarca, dizziness, palpitations, syncope.Some chest tightness that self resolves.   GI  No heartburn, indigestion, abdominal pain, nausea, vomiting, diarrhea, change in bowel habits, loss of appetite, bloody stools.   Resp: + shortness of breath with exertion none  at rest.  No excess mucus, no productive cough,  No non-productive cough,  No coughing up of blood.  No change in color of mucus.  Occasional wheezing.  No chest wall deformity  Skin: no rash or lesions.  GU: no dysuria, change in color of urine, no urgency or frequency.  No flank pain, no hematuria   MS:  No joint pain or swelling.  No decreased range of motion.  No back pain.  Psych:  No change in mood or affect. No depression or anxiety.  No memory loss.   Vital Signs BP 138/88  (BP Location: Left Arm, Cuff Size: Normal)   Pulse 100   Ht  (1.6 m)   SpO2 99%   BMI 24.09 kg/m    Physical Exam:  General- No distress,  A&Ox3, pleasant ENT: No sinus tenderness, TM clear, pale nasal mucosa, no oral exudate,no post nasal drip, no LAN Cardiac: S1, S2, regular rate and rhythm, no murmur Chest: No wheeze/ rales/ dullness; no accessory muscle use, no nasal flaring, no sternal retractions Abd.: Soft Non-tender, ND, BS +, Body mass index is 24.09 kg/m. Ext: No clubbing cyanosis, edema Neuro:  normal strength, MAE x 4, A&O x 3 Skin: No rashes, or lesions, warm and dry Psych: normal mood and behavior   Assessment/Plan  Allergic rhinitis Maintain  regimen We will renew your Proventil, Dulera, Flonase  and Singulair. Continue Dulera 2 puffs twice  daily  Rinse mouth after use. Continue Proventil as needed for shortness of breath or wheezing. Continue using you Flonase daily Maintain your seasonal allergy regimen Remember to remove shoes prior to going into your house. Shower and change clothes after you go outside. Follow up in 4 months with Dr. Vassie Loll or Maralyn Sago, NP If you need [place] sooner call the office  for a virtual visit We will provide you with a high risk letter.     Severe persistent allergic asthma with acute exacerbation Stable interval Plan We will renew your Proventil, Dulera, Flonase  and Singulair. We will give you a printed prescription for  Prednisone taper; 10 mg tablets: 4 tabs x 2 days, 3 tabs x 2 days, 2 tabs x 2 days 1 tab x 2 days then stop. Continue Dulera 2 puffs twice  daily  Rinse mouth after use. Continue Proventil as needed for shortness of breath or wheezing. Continue using you Flonase daily Maintain your seasonal allergy regimen Remember to remove shoes prior to going into your house. Shower and change clothes after you go outside. Resume Xolair injections as soon as it is available. Follow up in 4 months with Dr. Vassie Loll or  Maralyn Sago, NP If you need [place] sooner call the office  for a virtual visit We will provide you with a high risk letter.   Sips of water instead of throat clearing Sugar Free Coca-Cola or Werther's originals for throat soothing. Delsym Cough syrup 5 cc's every 12 hours Non-sedating antihistamine of your choice daily ( Zyrtec, Allegra, Tonita Phoenix, Claritin ( Generic ok)     Bevelyn Ngo, NP 12/28/2018  2:50 PM            Person Under Monitoring Name: Katlen Lagnese  Location: [address] [name] [address]   Infection Prevention Recommendations for Individuals Confirmed to have, or Being Evaluated for, 2019 Novel Coronavirus (COVID-19) Infection Who Receive Care at Home  Individuals who are confirmed to have, or are being evaluated for, COVID-19 should follow the prevention steps below until a healthcare provider or local or state health department says they can return to normal activities.  Stay home except to get medical care You should restrict activities outside your home, except for getting medical care. Do not go to work, school, or public areas, and do not use public transportation or taxis.  Call ahead before visiting your doctor Before your medical appointment, call the healthcare provider and tell them that you have, or are being evaluated for, COVID-19 infection. This will help the healthcare provider's office take steps to keep other people from getting infected. Ask your healthcare provider to call the local or state health department.  Monitor your symptoms Seek prompt medical attention if your illness is worsening (e.g., difficulty breathing). Before going to your medical appointment, call the healthcare provider and tell them that you have, or are being evaluated for, COVID-19 infection. Ask your healthcare provider to call the local or state health department.  Wear a facemask You should wear a facemask that covers your nose and mouth when you  are in the same room with other people and when you visit a healthcare provider. People who live with or visit you should also wear a facemask while they are in the same room with you.  Separate yourself from other people in your home As much as possible, you should stay in  a different room from other people in your home. Also, you should use a separate bathroom, if available.  Avoid sharing household items You should not share dishes, drinking glasses, cups, eating utensils, towels, bedding, or other items with other people in your home. After using these items, you should wash them thoroughly with soap and water.  Cover your coughs and sneezes Cover your mouth and nose with a tissue when you cough or sneeze, or you can cough or sneeze into your sleeve. Throw used tissues in a lined trash can, and immediately wash your hands with soap and water for at least 20 seconds or use an alcohol-based hand rub.  Wash your [employer] your hands often and thoroughly with soap and water for at least 20 seconds. You can use an alcohol-based hand sanitizer if soap and water are not available and if your hands are not visibly dirty. Avoid touching your eyes, nose, and mouth with unwashed hands.   Prevention Steps for Caregivers and Household Members of Individuals Confirmed to have, or Being Evaluated for, COVID-19 Infection Being Cared for in the Home  If you live with, or provide care at home for, a person confirmed to have, or being evaluated for, COVID-19 infection please follow these guidelines to prevent infection:  Follow healthcare provider's instructions Make sure that you understand and can help the patient follow any healthcare provider instructions for all care.  Provide for the patient's basic needs You should help the patient with basic needs in the home and provide support for getting groceries, prescriptions, and other personal needs.  Monitor the patient's symptoms If they are  getting sicker, call his or her medical provider and tell them that the patient has, or is being evaluated for, COVID-19 infection. This will help the healthcare provider's office take steps to keep other people from getting infected. Ask the healthcare provider to call the local or state health department.  Limit the number of people who have contact with the patient  If possible, have only one caregiver for the patient.  Other household members should stay in another home or place of residence. If this is not possible, they should stay  in another room, or be separated from the patient as much as possible. Use a separate bathroom, if available.  Restrict visitors who do not have an essential need to be in the home.  Keep older adults, very young children, and other sick people away from the patient Keep older adults, very young children, and those who have compromised immune systems or chronic health conditions away from the patient. This includes people with chronic heart, lung, or kidney conditions, diabetes, and cancer.  Ensure good ventilation Make sure that shared spaces in the home have good air flow, such as from an air conditioner or an opened window, weather permitting.  Wash your hands often  Wash your hands often and thoroughly with soap and water for at least 20 seconds. You can use an alcohol based hand sanitizer if soap and water are not available and if your hands are not visibly dirty.  Avoid touching your eyes, nose, and mouth with unwashed hands.  Use disposable paper towels to dry your hands. If not available, use dedicated cloth towels and replace them when they become wet.  Wear a facemask and gloves  Wear a disposable facemask at all times in the room and gloves when you touch or have contact with the patient's blood, body fluids, and/or secretions or excretions, such as  sweat, saliva, sputum, nasal mucus, vomit, urine, or feces.  Ensure the mask fits over your  nose and mouth tightly, and do not touch it during use.  Throw out disposable facemasks and gloves after using them. Do not reuse.  Wash your hands immediately after removing your facemask and gloves.  If your personal clothing becomes contaminated, carefully remove clothing and launder. Wash your hands after handling contaminated clothing.  Place all used disposable facemasks, gloves, and other waste in a lined container before disposing them with other household waste.  Remove gloves and wash your hands immediately after handling these items.  Do not share dishes, glasses, or other household items with the patient  Avoid sharing household items. You should not share dishes, drinking glasses, cups, eating utensils, towels, bedding, or other items with a patient who is confirmed to have, or being evaluated for, COVID-19 infection.  After the person uses these items, you should wash them thoroughly with soap and water.  Wash laundry thoroughly  Immediately remove and wash clothes or bedding that have blood, body fluids, and/or secretions or excretions, such as sweat, saliva, sputum, nasal mucus, vomit, urine, or feces, on them.  Wear gloves when handling laundry from the patient.  Read and follow directions on labels of laundry or clothing items and detergent. In general, wash and dry with the warmest temperatures recommended on the label.  Clean all areas the individual has used often  Clean all touchable surfaces, such as counters, tabletops, doorknobs, bathroom fixtures, toilets, phones, keyboards, tablets, and bedside tables, every day. Also, clean any surfaces that may have blood, body fluids, and/or secretions or excretions on them.  Wear gloves when cleaning surfaces the patient has come in contact with.  Use a diluted bleach solution (e.g., dilute bleach with 1 part bleach and 10 parts water) or a household disinfectant with a label that says EPA-registered for coronaviruses.  To make a bleach solution at home, add 1 tablespoon of bleach to 1 quart (4 cups) of water. For a larger supply, add  cup of bleach to 1 gallon (16 cups) of water.  Read labels of cleaning products and follow recommendations provided on product labels. Labels contain instructions for safe and effective use of the cleaning product including precautions you should take when applying the product, such as wearing gloves or eye protection and making sure you have good ventilation during use of the product.  Remove gloves and wash hands immediately after cleaning.  Monitor yourself for signs and symptoms of illness Caregivers and household members are considered close contacts, should monitor their health, and will be asked to limit movement outside of the home to the extent possible. Follow the monitoring steps for close contacts listed on the symptom monitoring form.   ? If you have additional questions, contact your local health department or call the epidemiologist on call at [phone] (available 24/7). ? This guidance is subject to change. For the most up-to-date guidance from [facility], please refer to their website: [email or web address]

## 2019-01-11 ENCOUNTER — Telehealth: Payer: Self-pay | Admitting: Acute Care

## 2019-01-12 NOTE — Telephone Encounter (Signed)
Rec'd completed forms - Fwd to [employer] via interoffice mail -pr  °

## 2019-03-04 DIAGNOSIS — F900 Attention-deficit hyperactivity disorder, predominantly inattentive type: Secondary | ICD-10-CM | POA: Diagnosis not present

## 2019-03-04 DIAGNOSIS — F411 Generalized anxiety disorder: Secondary | ICD-10-CM | POA: Diagnosis not present

## 2019-03-04 DIAGNOSIS — F3342 Major depressive disorder, recurrent, in full remission: Secondary | ICD-10-CM | POA: Diagnosis not present

## 2019-03-22 DIAGNOSIS — Z0184 Encounter for antibody response examination: Secondary | ICD-10-CM | POA: Diagnosis not present

## 2019-03-22 DIAGNOSIS — Z1159 Encounter for screening for other viral diseases: Secondary | ICD-10-CM | POA: Diagnosis not present

## 2019-04-26 DIAGNOSIS — F4322 Adjustment disorder with anxiety: Secondary | ICD-10-CM | POA: Diagnosis not present

## 2019-05-02 DIAGNOSIS — F4322 Adjustment disorder with anxiety: Secondary | ICD-10-CM | POA: Diagnosis not present

## 2019-05-09 DIAGNOSIS — F4322 Adjustment disorder with anxiety: Secondary | ICD-10-CM | POA: Diagnosis not present

## 2019-05-25 DIAGNOSIS — F4322 Adjustment disorder with anxiety: Secondary | ICD-10-CM | POA: Diagnosis not present

## 2019-07-19 ENCOUNTER — Other Ambulatory Visit: Payer: Self-pay | Admitting: Acute Care

## 2019-08-20 ENCOUNTER — Other Ambulatory Visit: Payer: Self-pay | Admitting: Acute Care

## 2019-08-24 DIAGNOSIS — H04123 Dry eye syndrome of bilateral lacrimal glands: Secondary | ICD-10-CM | POA: Diagnosis not present

## 2019-08-24 DIAGNOSIS — H18513 Endothelial corneal dystrophy, bilateral: Secondary | ICD-10-CM | POA: Diagnosis not present

## 2019-08-24 DIAGNOSIS — H18519 Endothelial corneal dystrophy, unspecified eye: Secondary | ICD-10-CM | POA: Diagnosis not present

## 2019-08-24 DIAGNOSIS — H0288A Meibomian gland dysfunction right eye, upper and lower eyelids: Secondary | ICD-10-CM | POA: Diagnosis not present

## 2019-08-24 DIAGNOSIS — H0102B Squamous blepharitis left eye, upper and lower eyelids: Secondary | ICD-10-CM | POA: Diagnosis not present

## 2019-08-24 DIAGNOSIS — H0102A Squamous blepharitis right eye, upper and lower eyelids: Secondary | ICD-10-CM | POA: Diagnosis not present

## 2019-08-24 DIAGNOSIS — H52203 Unspecified astigmatism, bilateral: Secondary | ICD-10-CM | POA: Diagnosis not present

## 2019-08-24 DIAGNOSIS — H0288B Meibomian gland dysfunction left eye, upper and lower eyelids: Secondary | ICD-10-CM | POA: Diagnosis not present

## 2019-09-20 ENCOUNTER — Encounter: Payer: Self-pay | Admitting: Acute Care

## 2019-09-20 ENCOUNTER — Ambulatory Visit (INDEPENDENT_AMBULATORY_CARE_PROVIDER_SITE_OTHER): Payer: BC Managed Care – PPO | Admitting: Acute Care

## 2019-09-20 ENCOUNTER — Other Ambulatory Visit: Payer: Self-pay

## 2019-09-20 VITALS — BP 130/90 | HR 83 | Temp 97.3°F | Ht 63.0 in | Wt 130.0 lb

## 2019-09-20 DIAGNOSIS — J301 Allergic rhinitis due to pollen: Secondary | ICD-10-CM

## 2019-09-20 DIAGNOSIS — J4551 Severe persistent asthma with (acute) exacerbation: Secondary | ICD-10-CM | POA: Diagnosis not present

## 2019-09-20 NOTE — Progress Notes (Addendum)
History of Present Illness Alexis Duffy is a 48 y.o. femalenever smokerwith history ofsevere persistent atopic asthma with history of multiple positive skin tests. 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 She had a lapse in therapy 05/2017, but has resumed therapy 2019, and has not had therapy since..She is followed by Dr. Elsworth Soho.    09/20/2019  Pt. Presents for annual follow up. She states she has been doing well. She has not been on Xolair. She never followed through with setting this up, then COVID hit. She has not worked for 6 months during the height of the [place] 19 pandemic. She returned to work September 2020. She has had a few flares. She went to see [facility] for these . She was treated with prednisone, and never required antibiotics. She states she has been compliant with her Dulera. She has not had to use her rescue inhaler as she was not working. She uses her Flonase every day. She uses her Singulair daily. She did have a bit of a flare the other day at work due to new fire wood at home. She feels this triggered her. She denies any fever, chest pain, orthopnea. She feels she will have more flares now that she is working again. The mandatory masks cause her discomfort. She wants to resume [place] paperwork and injections. She needs her FMLA paperwork renewed by 12/31.  Test Results: 08/2013 Total IgE 473.7  IgG profiles:elevations for beef, milk, lesser reactions to egg, corn.  07/2014 SPiro - ratio 68, FEv1 80% 07/2015 spiro - FEV1 92%, ratio 72  FENO 10/19/2016>>161    Past medical hx Past Medical History:  Diagnosis Date  . ADHD (attention deficit hyperactivity disorder)   . Allergic rhinitis   . Asthma   . Hypothyroid   . Low testosterone      Social History   Tobacco Use  . Smoking status: Never Smoker  . Smokeless tobacco: Never Used  Substance Use Topics  . Alcohol use: Yes   Comment: 1-2 drinks per month  . Drug use: No    Ms.Fleisher reports that she has never smoked. She has never used smokeless tobacco. She reports current alcohol use. She reports that she does not use drugs.  Tobacco Cessation: Never smoker  Past surgical hx, Family hx, Social hx all reviewed.  Current Outpatient Medications on File Prior to Visit  Medication Sig  . albuterol (PROAIR HFA) 108 (90 Base) MCG/ACT inhaler INHALE TWO PUFFS INTO LUNGS EVERY 4 HOURS AS NEEDED FOR WHEEZING OR SHORTNESS OF BREATH  . albuterol (PROVENTIL HFA;VENTOLIN HFA) 108 (90 Base) MCG/ACT inhaler Inhale 2 puffs into the lungs every 4 (four) hours as needed for wheezing or shortness of breath.  Marland Kitchen albuterol (PROVENTIL) (2.5 MG/3ML) 0.083% nebulizer solution Take 3 mLs (2.5 mg total) by nebulization every 4 (four) hours as needed for wheezing or shortness of breath.  . ALPRAZolam (XANAX) 0.5 MG tablet Take 0.5 mg by mouth 2 (two) times daily as needed for anxiety.  Marland Kitchen amphetamine-dextroamphetamine (ADDERALL) 20 MG tablet Take 20 mg by mouth 3 (three) times daily.  . famotidine (PEPCID) 20 MG tablet Take 1 tablet (20 mg total) by mouth 2 (two) times daily.  . fluticasone (FLONASE) 50 MCG/ACT nasal spray Place 2 sprays into both nostrils daily.  . mometasone-formoterol (DULERA) 200-5 MCG/ACT AERO Inhale 2 puffs into the lungs 2 (two) times daily.  . montelukast (SINGULAIR) 10 MG tablet TAKE 1 TABLET  BY MOUTH AT BEDTIME  . XOLAIR 150 MG injection INJECT 300 MG UNDER THE SKIN EVERY 2 WEEKS (Patient not taking: Reported on 09/20/2019)   No current facility-administered medications on file prior to visit.     Allergies  Allergen Reactions  . Asa [Aspirin]     Increased SOB    Review Of Systems:  Constitutional:   No  weight loss, night sweats,  Fevers, chills, fatigue, or  lassitude.  HEENT:   No headaches,  Difficulty swallowing,  Tooth/dental problems, or  Sore throat,                No sneezing, itching, ear  ache, nasal congestion, post nasal drip,   CV:  No chest pain,  Orthopnea, PND, swelling in lower extremities, anasarca, dizziness, palpitations, syncope.   GI  No heartburn, indigestion, abdominal pain, nausea, vomiting, diarrhea, change in bowel habits, loss of appetite, bloody stools.   Resp: No shortness of breath with exertion or at rest.  No excess mucus, no productive cough,  + non-productive cough with asthma triggers, ,  No coughing up of blood.  No change in color of mucus.  No wheezing.  No chest wall deformity  Skin: no rash or lesions.  GU: no dysuria, change in color of urine, no urgency or frequency.  No flank pain, no hematuria   MS:  No joint pain or swelling.  No decreased range of motion.  No back pain.  Psych:  No change in mood or affect. No depression or anxiety.  No memory loss.   Vital Signs BP 130/90 (BP Location: Left Arm, Cuff Size: Normal)   Pulse 83   Temp (!) 97.3 F (36.3 C) (Temporal)   Ht 5\' 3"  (1.6 m)   Wt 130 lb (59 kg)   SpO2 100%   BMI 23.03 kg/m    Physical Exam:  General- No distress,  A&Ox3, pleasant ENT: No sinus tenderness, TM clear, pale nasal mucosa, no oral exudate,no post nasal drip, no LAN Cardiac: S1, S2, regular rate and rhythm, no murmur Chest: No wheeze/ rales/ dullness; no accessory muscle use, no nasal flaring, no sternal retractions Abd.: Soft Non-tender, ND, BS +, Body mass index is 23.03 kg/m. Ext: No clubbing cyanosis, edema Neuro:  normal strength, MAE x 4, A&O x 3 Skin: No rashes, no lesions, tattoo on L wrist, warm and dry Psych: normal mood and behavior   Assessment/Plan Allergic Rhinitis>> stable x 6 months as she has not been at work Severe persistent atopic asthma  No Xolair injections since 2019 Plan Maintain Allergy/ Asthma regimen We will renew your Proventil, Dulera, Flonase  and Singulair. Continue Dulera 2 puffs twice  daily  Rinse mouth after use. Continue Albuterol  as needed for shortness of  breath or wheezing. Start the paperwork for Xolair  We will start paperwork for FMLA ( Due 10/05/2019) Follow up in 4 months with Dr. 10/07/2019 or Vassie Loll, NP If you need Maralyn Sago sooner call the office.  This appointment was over 45 minutes  minutes long with over 50% of the time being direct face to face patient care, assessment , plan of care , and follow up,  Pt needed FMLA paperwork completed and Xolair  paperwork initiated  [place], NP 09/20/2019 5:11 PM

## 2019-09-20 NOTE — Patient Instructions (Signed)
It is good to see you today Maintain your Allergy/ Asthma regimen Let us know when we need to renew your Proventil, Flonase  and Singulair. Continue Dulera 2 puffs twice  daily  Rinse mouth after use. Continue Albuterol  as needed for shortness of breath or wheezing. We will start  the paperwork for Xolair Call the office if you don't hear about drug arriving.   We will start the renewal paperwork for FMLA ( Due 10/05/2019) Follow up in 4 months with Dr. Elsworth Soho or Judson Roch, NP If you need [place] sooner call the office. Please contact office for sooner follow up if symptoms do not improve or worsen or seek emergency care

## 2019-09-27 ENCOUNTER — Other Ambulatory Visit: Payer: Self-pay | Admitting: Acute Care

## 2019-10-03 ENCOUNTER — Telehealth: Payer: Self-pay | Admitting: Acute Care

## 2019-10-03 DIAGNOSIS — J4551 Severe persistent asthma with (acute) exacerbation: Secondary | ICD-10-CM

## 2019-10-03 MED ORDER — FLUTICASONE PROPIONATE 50 MCG/ACT NA SUSP: 2.0000 | Freq: Every day | NASAL | 3 refills | Status: DC

## 2019-10-03 MED ORDER — ALBUTEROL SULFATE HFA 108 (90 BASE) MCG/ACT IN AERS: INHALATION_SPRAY | RESPIRATORY_TRACT | 3 refills | Status: DC

## 2019-10-03 MED ORDER — DULERA 200-5 MCG/ACT IN AERO: 2.0000 | INHALATION_SPRAY | Freq: Two times a day (BID) | RESPIRATORY_TRACT | 3 refills | Status: DC

## 2019-10-03 MED ORDER — MONTELUKAST SODIUM 10 MG PO TABS: ORAL_TABLET | ORAL | 3 refills | Status: DC

## 2019-10-03 MED ORDER — ALBUTEROL SULFATE (2.5 MG/3ML) 0.083% IN NEBU: 2.5000 mg | INHALATION_SOLUTION | RESPIRATORY_TRACT | 3 refills | Status: DC | PRN

## 2019-10-03 NOTE — Telephone Encounter (Signed)
Spoke with pt and advised rx's sent to pharmacy. Nothing further is needed.   

## 2019-10-04 ENCOUNTER — Telehealth: Payer: Self-pay | Admitting: Acute Care

## 2019-10-10 NOTE — Telephone Encounter (Signed)
I did receive these forms and they were sent back to Vern at [employer]. Nothing further is needed.

## 2019-11-02 DIAGNOSIS — F411 Generalized anxiety disorder: Secondary | ICD-10-CM | POA: Diagnosis not present

## 2019-11-02 DIAGNOSIS — F900 Attention-deficit hyperactivity disorder, predominantly inattentive type: Secondary | ICD-10-CM | POA: Diagnosis not present

## 2019-12-06 ENCOUNTER — Other Ambulatory Visit: Payer: Self-pay

## 2019-12-06 MED ORDER — MONTELUKAST SODIUM 10 MG PO TABS: ORAL_TABLET | ORAL | 0 refills | Status: DC

## 2020-03-09 DIAGNOSIS — J4540 Moderate persistent asthma, uncomplicated: Secondary | ICD-10-CM | POA: Diagnosis not present

## 2020-03-14 DIAGNOSIS — J45901 Unspecified asthma with (acute) exacerbation: Secondary | ICD-10-CM | POA: Diagnosis not present

## 2020-03-14 DIAGNOSIS — R0602 Shortness of breath: Secondary | ICD-10-CM | POA: Diagnosis not present

## 2020-03-14 DIAGNOSIS — Z886 Allergy status to analgesic agent status: Secondary | ICD-10-CM | POA: Diagnosis not present

## 2020-03-14 DIAGNOSIS — R062 Wheezing: Secondary | ICD-10-CM | POA: Diagnosis not present

## 2020-03-14 DIAGNOSIS — R05 Cough: Secondary | ICD-10-CM | POA: Diagnosis not present

## 2020-04-26 DIAGNOSIS — F900 Attention-deficit hyperactivity disorder, predominantly inattentive type: Secondary | ICD-10-CM | POA: Diagnosis not present

## 2020-04-26 DIAGNOSIS — F411 Generalized anxiety disorder: Secondary | ICD-10-CM | POA: Diagnosis not present

## 2020-04-26 DIAGNOSIS — F3342 Major depressive disorder, recurrent, in full remission: Secondary | ICD-10-CM | POA: Diagnosis not present

## 2020-07-17 DIAGNOSIS — H18513 Endothelial corneal dystrophy, bilateral: Secondary | ICD-10-CM | POA: Diagnosis not present

## 2020-07-17 DIAGNOSIS — H04123 Dry eye syndrome of bilateral lacrimal glands: Secondary | ICD-10-CM | POA: Diagnosis not present

## 2020-07-17 DIAGNOSIS — H0288B Meibomian gland dysfunction left eye, upper and lower eyelids: Secondary | ICD-10-CM | POA: Diagnosis not present

## 2020-07-17 DIAGNOSIS — H0102A Squamous blepharitis right eye, upper and lower eyelids: Secondary | ICD-10-CM | POA: Diagnosis not present

## 2020-07-17 DIAGNOSIS — H524 Presbyopia: Secondary | ICD-10-CM | POA: Diagnosis not present

## 2020-07-17 DIAGNOSIS — H0288A Meibomian gland dysfunction right eye, upper and lower eyelids: Secondary | ICD-10-CM | POA: Diagnosis not present

## 2020-07-17 DIAGNOSIS — H0102B Squamous blepharitis left eye, upper and lower eyelids: Secondary | ICD-10-CM | POA: Diagnosis not present

## 2020-09-02 DIAGNOSIS — J4540 Moderate persistent asthma, uncomplicated: Secondary | ICD-10-CM | POA: Diagnosis not present

## 2020-09-02 DIAGNOSIS — I159 Secondary hypertension, unspecified: Secondary | ICD-10-CM | POA: Diagnosis not present

## 2020-09-02 DIAGNOSIS — F988 Other specified behavioral and emotional disorders with onset usually occurring in childhood and adolescence: Secondary | ICD-10-CM | POA: Diagnosis not present

## 2020-09-26 DIAGNOSIS — Z1231 Encounter for screening mammogram for malignant neoplasm of breast: Secondary | ICD-10-CM | POA: Diagnosis not present

## 2020-09-26 DIAGNOSIS — Z9882 Breast implant status: Secondary | ICD-10-CM | POA: Diagnosis not present

## 2020-10-21 DIAGNOSIS — F410 Panic disorder [episodic paroxysmal anxiety] without agoraphobia: Secondary | ICD-10-CM | POA: Diagnosis not present

## 2020-10-21 DIAGNOSIS — F900 Attention-deficit hyperactivity disorder, predominantly inattentive type: Secondary | ICD-10-CM | POA: Diagnosis not present

## 2020-10-21 DIAGNOSIS — F411 Generalized anxiety disorder: Secondary | ICD-10-CM | POA: Diagnosis not present

## 2020-11-05 ENCOUNTER — Telehealth: Payer: Self-pay | Admitting: Acute Care

## 2020-11-05 MED ORDER — PREDNISONE 10 MG PO TABS: ORAL_TABLET | ORAL | 0 refills | Status: AC

## 2020-11-05 NOTE — Telephone Encounter (Signed)
Called and spoke with patient.  She states her symptoms started 2 days ago. No fever, only cough, congestion, wheezing, and chest tightness. She has not been tested for covid. 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 I told her we suggest her to go get tested today or tomorrow.   Dr. Vassie Loll and further suggestions to manage symptoms, patient believes it is related to her asthma bronchitis.

## 2020-11-05 NOTE — Telephone Encounter (Signed)
Last seen 09/2019. She needs office visit -in person or virtual , may need prednisone taper Is She  still on Xolair?

## 2020-11-05 NOTE — Telephone Encounter (Signed)
Prednisone 10 mg tabs  Take 2 tabs daily with food x 5ds, then 1 tab daily with food x 5ds then STOP Virtual visit please

## 2020-11-05 NOTE — Telephone Encounter (Signed)
Lmtcb for pt.  

## 2020-11-05 NOTE — Telephone Encounter (Signed)
Pt calling back to state that she has done a rapid test and it came back positive and would like to get something called in for her symptoms. Pharmacy of choice [facility] in [place]. Pt can be reached at [phone]

## 2020-11-05 NOTE — Telephone Encounter (Signed)
Called spoke with patient Let her know Dr. Reginia Naas recommendations Patient wasn't signed up for mychart so sent her the link via text, she received it while on the phone.  Appointment changed to video visit.  Pharmacy verified Orders placed Nothing further needed at this time.

## 2020-11-05 NOTE — Telephone Encounter (Signed)
Patient has appointment 11/06/20 with SG at 4:30. Does patient need to get tested? Or can this still be an in person visit?

## 2020-11-06 ENCOUNTER — Ambulatory Visit: Payer: PRIVATE HEALTH INSURANCE | Admitting: Acute Care

## 2020-11-06 ENCOUNTER — Ambulatory Visit (INDEPENDENT_AMBULATORY_CARE_PROVIDER_SITE_OTHER): Payer: BC Managed Care – PPO | Admitting: Acute Care

## 2020-11-06 ENCOUNTER — Encounter: Payer: Self-pay | Admitting: Acute Care

## 2020-11-06 ENCOUNTER — Other Ambulatory Visit: Payer: Self-pay

## 2020-11-06 DIAGNOSIS — J4551 Severe persistent asthma with (acute) exacerbation: Secondary | ICD-10-CM

## 2020-11-06 DIAGNOSIS — U071 COVID-19: Secondary | ICD-10-CM

## 2020-11-06 MED ORDER — MONTELUKAST SODIUM 10 MG PO TABS: ORAL_TABLET | ORAL | 11 refills | Status: DC

## 2020-11-06 MED ORDER — LEVOFLOXACIN 500 MG PO TABS: 500.0000 mg | ORAL_TABLET | Freq: Every day | ORAL | 0 refills | Status: DC

## 2020-11-06 MED ORDER — HYDROCODONE-HOMATROPINE 5-1.5 MG/5ML PO SYRP: 5.0000 mL | ORAL_SOLUTION | Freq: Four times a day (QID) | ORAL | 0 refills | Status: DC | PRN

## 2020-11-06 NOTE — Patient Instructions (Signed)
It is good to speak with you today.  Continue to [employer] as [place] at work has advised.  Complete the prednisone taper Dr. Vassie Loll sent in 11/05/2020 We will add Levaquin 500 mg once daily x 5 days for the thick brown secretions. We will renew your Singulair Take daily Hycodan Cough syrup 5 ml at bedtime for cough ( 120 cc's) Call the office once [place] at work clears you to leave [facility] will need an OV with CXR as follow up.  Continue Dulera as you have been doing Rinse mouth after use Continue using albuterol nebs as you have been doing.  Seek emergency care for shortness of breath that does not resolve with you Asthma regimen. Call us if you need [place] sooner.

## 2020-11-06 NOTE — Progress Notes (Signed)
Virtual Visit via Telephone Note  I connected with Alexis Duffy on 11/06/20 at  4:30 PM EST by telephone and verified that I am speaking with the correct person using two identifiers.  Location: Patient: At home Provider: At Home   I discussed the limitations, risks, security and privacy concerns of performing an evaluation and management service by telephone and the availability of in person appointments. I also discussed with the patient that there may be a patient responsible charge related to this service. The patient expressed understanding and agreed to proceed.  Pt. Last seen 09/20/2019  Synopsis Alexis Gurleyis a 50 y.o.femalenever smokerwith history ofsevere persistent atopic asthma with history of multiple positive skin tests. 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 She had a lapse in therapy 05/2017, but has resumed therapy 2019, and has not had therapy since..She is followed by Dr. Vassie Loll.   Pt. Called the office 11/05/2020 confirming she had been tested for Covid and was found to be positive. Symptoms started 7 days ago, initially with congestion. No fever, only cough, congestion, wheezing, and chest tightness. She initially thought this was her asthma bronchitis, our office then suggested she test for Covid, which she did. She was surprised to find she was +. 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 She currently has cough and wheezing. She is using her Dulera. She has been out of her Singulair since the beginning of December. She is using her nebs as needed for breakthrough wheezing and shortness of breath. She is beyond the 5 day window for PO antivirals. She is at the 7 day mark for Monoclonal antibody. She has thick brown secretions.She understands she needs to seek emergency care for shortness of breath that she cannot resolve with her rescue medications.  Dr. Vassie Loll sent in a prescription  for a pred taper 11/05/2020 : Prednisone 10 mg tabs Take 2 tabs daily with food x 5ds, then 1 tab daily with food x 5ds then STOP, and requested a virtual visit. She started her taper 11/05/2020.   She is fully vaccinated, booster Nov 2021   History of Present Illness: Pt. Presents for virtual visit.  Pt. Called the office 11/05/2020 confirming she had been tested for Covid and was found to be positive. Symptoms started 7 days ago, initially with congestion. No fever, only cough, congestion, wheezing, and chest tightness. She initially thought this was her asthma bronchitis, our office then suggested she test for Covid, which she did. She was surprised to find she was +. 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 She currently has cough and wheezing. She is using her Dulera. She has been out of her Singulair since the beginning of December. She is using her nebs as needed for breakthrough wheezing and shortness of breath. She is beyond the 5 day window for PO antivirals. She is at the 7 day mark for Monoclonal antibody. She has thick brown secretions.This is a significant change for her. She has not been taking her Xolair. She states the office has not managed to get her the medication for at home injection. She understands she needs to seek emergency care for shortness of breath that she cannot resolve with her rescue medications.  Dr. Vassie Loll sent in a prescription for a pred taper 11/05/2020 : Prednisone 10 mg tabs Take 2 tabs daily with food x 5ds, then 1  tab daily with food x 5ds then STOP, and requested a virtual visit. She started her taper 11/05/2020. I will call in Levaquin 500 mg x 5 days along with a renewal of her Singulair and Hycodan for her cough. She is Day 7 of symptoms. She is expecting a  call from health at work to release her from [employer].I have asked her to call the office once she is no longer quarantined , to request an in office visit with a CXR. She  verbalized understanding of this, and states she would call the office for an appointment as soon as she was cleared by health at work. She knows to seek emergency care for shortness of breath / wheezing that does not resolve with her asthma regimen..     Observations/Objective: Pt. Speaking in full  sentences. No audible wheezing or distress that I could hear over the phone.   Assessment and Plan: Asthma Flare 2/2 Covid 19 virus Pt is not on Xolair Off Singulair since December.  Day 7 of symptoms, outside window for oral antivirals and for monoclonal antibody Plan Complete the prednisone taper Dr. Vassie Loll sent in 11/05/2020 Levaquin 500 mg once daily x 5 days We will renew your Singulair Hycodan Cough syrup 5 ml at bedtime for cough ( 120 cc's) Call the office once [name] at work clears you to leave [facility] will need an OV with CXR as follow up.  Continue Dulera as you have been doing Rinse mouth after use Continue using albuterol nebs as you have been doing.  Seek emergency care for shortness of breath that does not resolve with you Asthma regimen.   Follow Up Instructions: Follow up in 1-2 weeks in office with CXR once cleared by [place] at Work.    I discussed the assessment and treatment plan with the patient. The patient was provided an opportunity to ask questions and all were answered. The patient agreed with the plan and demonstrated an understanding of the instructions.   The patient was advised to call back or seek an in-person evaluation if the symptoms worsen or if the condition fails to improve as anticipated.  I provided 35  minutes of non-face-to-face time during this encounter.  Addendum Pt. Called in requesting hycodan be sent to another RX as Alexis Duffy was unable to fill until next week. I attempted to [facility]  [facility] [address] [place], but they informed me there was a national back order . I have sent in Phenergan with Codeine  to the original [facility] in [place],.This is for cough at HS only prn.  Medication  for 15 doses was sent in.    Bevelyn Ngo, NP 11/06/2020 6:09 PM

## 2020-11-07 MED ORDER — PROMETHAZINE-CODEINE 6.25-10 MG/5ML PO SYRP: 5.0000 mL | ORAL_SOLUTION | Freq: Every evening | ORAL | 0 refills | Status: DC | PRN

## 2020-11-07 NOTE — Addendum Note (Signed)
Addended by: Kandice Robinsons F on: 11/07/2020 01:10 PM   Modules accepted: Orders

## 2020-11-07 NOTE — Telephone Encounter (Signed)
Reply to pt's mychart message has been sent in regards to info stated by SG. Nothing further needed.

## 2020-11-07 NOTE — Telephone Encounter (Signed)
Please advise on patient mychart message  Can [facility] please [facility] [address] [facility] [place] can not fill it until next week. Thank you

## 2020-11-07 NOTE — Addendum Note (Signed)
Addended by: Pilar Grammes on: 11/07/2020 12:39 PM   Modules accepted: Orders

## 2020-11-11 NOTE — Telephone Encounter (Signed)
SG please advise. Thanks   

## 2020-11-14 NOTE — Telephone Encounter (Signed)
Dr. Marchelle Gearing, Please see comment from patient and advise regarding steroids  predniSONE (DELTASONE) 10 MG tablet 20 mg, Daily with breakfast Starting Tue 11/05/2020, For 5 days, THEN  10 mg, Daily with breakfast Starting Sun 11/10/2020, For 5 days    and cough medication (promethazine-codeine (PHENERGAN WITH CODEINE) 6.25-10 MG/5ML syrup)  Thank you.

## 2020-11-15 MED ORDER — PREDNISONE 10 MG PO TABS: ORAL_TABLET | ORAL | 0 refills | Status: AC

## 2020-11-15 NOTE — Telephone Encounter (Signed)
She has a history of severe asthma.  I accept her request for repeat prednisone taper at a higher dose therefore plan   Please take Take prednisone 40mg  once daily x 3 days, then 30mg  once daily x 3 days, then 20mg  once daily x 3 days, then prednisone 10mg  once daily  x 3 days and stop

## 2020-11-20 ENCOUNTER — Other Ambulatory Visit: Payer: Self-pay

## 2020-11-20 ENCOUNTER — Ambulatory Visit: Payer: BC Managed Care – PPO

## 2020-11-20 ENCOUNTER — Other Ambulatory Visit: Payer: Self-pay | Admitting: Acute Care

## 2020-11-20 ENCOUNTER — Ambulatory Visit (INDEPENDENT_AMBULATORY_CARE_PROVIDER_SITE_OTHER): Payer: BC Managed Care – PPO | Admitting: Acute Care

## 2020-11-20 ENCOUNTER — Encounter: Payer: Self-pay | Admitting: Acute Care

## 2020-11-20 ENCOUNTER — Ambulatory Visit (INDEPENDENT_AMBULATORY_CARE_PROVIDER_SITE_OTHER): Payer: BC Managed Care – PPO

## 2020-11-20 VITALS — BP 122/82 | HR 87 | Temp 97.3°F | Ht 64.0 in | Wt 130.0 lb

## 2020-11-20 DIAGNOSIS — J45909 Unspecified asthma, uncomplicated: Secondary | ICD-10-CM | POA: Diagnosis not present

## 2020-11-20 DIAGNOSIS — R7611 Nonspecific reaction to tuberculin skin test without active tuberculosis: Secondary | ICD-10-CM | POA: Diagnosis not present

## 2020-11-20 DIAGNOSIS — R059 Cough, unspecified: Secondary | ICD-10-CM

## 2020-11-20 DIAGNOSIS — U071 COVID-19: Secondary | ICD-10-CM

## 2020-11-20 DIAGNOSIS — J4551 Severe persistent asthma with (acute) exacerbation: Secondary | ICD-10-CM

## 2020-11-20 MED ORDER — ALBUTEROL SULFATE (2.5 MG/3ML) 0.083% IN NEBU: 2.5000 mg | INHALATION_SOLUTION | RESPIRATORY_TRACT | 3 refills | Status: DC | PRN

## 2020-11-20 MED ORDER — HYDROCODONE-HOMATROPINE 5-1.5 MG/5ML PO SYRP: 5.0000 mL | ORAL_SOLUTION | Freq: Every evening | ORAL | 0 refills | Status: DC | PRN

## 2020-11-20 MED ORDER — MONTELUKAST SODIUM 10 MG PO TABS: ORAL_TABLET | ORAL | 11 refills | Status: DC

## 2020-11-20 MED ORDER — DULERA 200-5 MCG/ACT IN AERO: 2.0000 | INHALATION_SPRAY | Freq: Two times a day (BID) | RESPIRATORY_TRACT | 3 refills | Status: DC

## 2020-11-20 NOTE — Patient Instructions (Addendum)
It is good to see you. I am glad you are feeling better. CXR today We will call you with results I have sent in a prescription for Hycodan. Take 5 cc at bedtime  Do not drive if sleepy You can use Delsym and Robitussin when you are at work for cough. We will refill your Dulera, Singulair , and nebs. We will need to get your restarted on your Xolair. Please let us know when you have your new insurance so we can get this approved.  Continue Singulair 10 mg daily  Continue Dulera as you have been doing Rinse mouth after use Continue using albuterol nebs as you have been doing. Follow up in 3 months with Dr. Vassie Loll or Maralyn Sago NP Please contact office for sooner follow up if symptoms do not improve or worsen or seek emergency care   Seek emergency care for shortness of breath that does not quickly resolve with you Asthma regimen.

## 2020-11-20 NOTE — Progress Notes (Signed)
History of Present Illness Alexis Duffy is a 50 y.o. female with severe asthma.  She is followed by Dr. Vassie Loll.    Synopsis Alexis Duffy ([place]) Alexis Duffy a 50 y.o.femalenever smokerwith history ofsevere persistent atopic asthma with history of multiple positive skin tests. 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 She had a lapse in therapy 05/2017, but had resumed therapy2019, but stooed and  has not had therapy since.She is followed by Dr. Vassie Loll.  She is fully vaccinated, booster Nov 2021   11/20/2020 Follow up Covid +  / Asthma Flare Pt. Presents for follow up.She was seen for a tele visit 11/06/2020. At that time she had been diagnosed with Covid 19, and was having a secondary flare of her asthma. Pt. Called the office 11/05/2020 confirming she had been tested for Covid and was found to be positive. Symptoms started 7 days prior, initially with congestion.No fever, only cough, congestion, wheezing, and chest tightness. She initially thought this was her asthma bronchitis flaring, our office then suggested she test for Covid, which she did. She was surprised to find she was +. 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 She was experiencing  cough and wheezing. She was using her Dulera. She had been out of her Singulair since the beginning of December, and had not been using it.Marland Kitchen She was  using her nebs as needed for breakthrough wheezing and shortness of breath. She was beyond the 5 day window for PO antivirals. She was at the 7 day mark for Monoclonal antibody. She was experiencing  thick brown secretions.She understood that she needed to seek emergency care for shortness of breath that did not  resolve with her rescue medications.  Dr. Vassie Loll sent in a prescription for a pred taper 11/05/2020 : Prednisone 10 mg tabs Take 2 tabs daily with food x 5ds, then 1 tab daily with food x 5ds then STOP, and requested a virtual visit. She  started her taper 11/05/2020.   She was seen virtually 11/06/2020. for continued symptoms not resolved by the prednisone taper. At that time we prescribed her with Levaquin,and cough syrup with Codeine. Plan was for a follow up once she was cleared from [place] with CXR. She did have to call the office for a longer prednisone taper on 11/15/2020. She was prescribed Prednisone 40mg  once daily x 3 days, then 30mg  once daily x 3 days, then 20mg  once daily x 3 days, then prednisone 10mg  once daily  x 3 days and stop.   She presents today for the follow up appointment with CXR. She states she completed both the Levaquin and prednisone taper x 2. She continues to have a cough. She states secretions are no longer brown, but are a whitish yellow. She states she has been using the nebs every 4 hours when she is at home  which has been helping. She went back to work Saturday 2/22, 2/14, and 2/15. She has been taking her magnesium and zinc. She does have some right rib pain. She does still have a cough. She was actually taken the prednisone as 50 mg daily x 5 day. She did complete her Levaquin. Secretions are no longer brown. They are white to yellow.   We will send in cough syrup as she was unable to get the medication as it was on back order. She has had wheezing with exertion at work.     Test Results:  11/20/2020  CXR      Spirometry 2016 >> Mild airway obstruction    Past medical hx Past Medical History:  Diagnosis Date  . ADHD (attention deficit hyperactivity disorder)   . Allergic rhinitis   . Asthma   . Hypothyroid   . Low testosterone      Social History   Tobacco Use  . Smoking status: Never Smoker  . Smokeless tobacco: Never Used  Substance Use Topics  . Alcohol use: Yes    Comment: 1-2 drinks per month  . Drug use: No    Alexis Duffy reports that she has never smoked. She has never used smokeless tobacco. She reports current alcohol use. She reports that she does not use  drugs.  Tobacco Cessation: Never smoker  Past surgical hx, Family hx, Social hx all reviewed.  Current Outpatient Medications on File Prior to Visit  Medication Sig  . albuterol (PROAIR HFA) 108 (90 Base) MCG/ACT inhaler INHALE TWO PUFFS INTO LUNGS EVERY 4 HOURS AS NEEDED FOR WHEEZING OR SHORTNESS OF BREATH  . albuterol (PROVENTIL HFA;VENTOLIN HFA) 108 (90 Base) MCG/ACT inhaler Inhale 2 puffs into the lungs every 4 (four) hours as needed for wheezing or shortness of breath.  Marland Kitchen albuterol (PROVENTIL) (2.5 MG/3ML) 0.083% nebulizer solution Take 3 mLs (2.5 mg total) by nebulization every 4 (four) hours as needed for wheezing or shortness of breath.  . ALPRAZolam (XANAX) 0.5 MG tablet Take 0.5 mg by mouth 2 (two) times daily as needed for anxiety.  Marland Kitchen amphetamine-dextroamphetamine (ADDERALL) 20 MG tablet Take 20 mg by mouth 3 (three) times daily.  . famotidine (PEPCID) 20 MG tablet Take 1 tablet (20 mg total) by mouth 2 (two) times daily.  . fluticasone (FLONASE) 50 MCG/ACT nasal spray Place 2 sprays into both nostrils daily.  Marland Kitchen levofloxacin (LEVAQUIN) 500 MG tablet Take 1 tablet (500 mg total) by mouth daily.  . mometasone-formoterol (DULERA) 200-5 MCG/ACT AERO Inhale 2 puffs into the lungs 2 (two) times daily.  . montelukast (SINGULAIR) 10 MG tablet TAKE 1 TABLET BY MOUTH AT BEDTIME . APPOINTMENT REQUIRED FOR FUTURE REFILLS  . predniSONE (DELTASONE) 10 MG tablet Take 4 tablets (40 mg total) by mouth daily with breakfast for 3 days, THEN 3 tablets (30 mg total) daily with breakfast for 3 days, THEN 2 tablets (20 mg total) daily with breakfast for 3 days, THEN 1 tablet (10 mg total) daily with breakfast for 3 days.  [name] 150 MG injection INJECT 300 MG UNDER THE SKIN EVERY 2 WEEKS   No current facility-administered medications on file prior to visit.     Allergies  Allergen Reactions  . Asa [Aspirin]     Increased SOB    Review Of Systems:  Constitutional:   No  weight loss, night  sweats,  Fevers, chills, fatigue, or  lassitude.  HEENT:   No headaches,  Difficulty swallowing,  Tooth/dental problems, or  Sore throat,                No sneezing, itching, ear ache, nasal congestion, post nasal drip,   CV:  No chest pain,  Orthopnea, PND, swelling in lower extremities, anasarca, dizziness, palpitations, syncope.   GI  No heartburn, indigestion, abdominal pain, nausea, vomiting, diarrhea, change in bowel habits, loss of appetite, bloody stools.   Resp: No shortness of breath with exertion or at rest.  No excess mucus, + productive cough,  No non-productive cough,  No coughing up of blood.  No change in color of  mucus.  Occasional  wheezing.  No chest wall deformity  Skin: no rash or lesions.  GU: no dysuria, change in color of urine, no urgency or frequency.  No flank pain, no hematuria   MS:  No joint pain or swelling.  No decreased range of motion.  No back pain.  Psych:  No change in mood or affect. No depression or anxiety.  No memory loss.   Vital Signs BP 122/82 (BP Location: Right Arm, Cuff Size: Normal)   Pulse 87   Temp (!) 97.3 F (36.3 C) (Oral)   Ht 5\' 4"  (1.626 m)   Wt 130 lb (59 kg)   SpO2 99%   BMI 22.31 kg/m    Physical Exam:  General- No distress,  A&Ox3, pleasant ENT: No sinus tenderness, TM clear, pale nasal mucosa, no oral exudate,no post nasal drip, no LAN Cardiac: S1, S2, regular rate and rhythm, no murmur Chest: No  wheeze/ rales/ dullness; no accessory muscle use, no nasal flaring, no sternal retractions Abd.: Soft Non-tender, ND, NT, BS + Ext: No clubbing cyanosis, edema Neuro:  normal strength, MAE x 4, A&O x 3, appropriate Skin: No rashes, warm and dry, no lesions Psych: normal mood and behavior   Assessment/Plan  Asthma Flare 2/2 Covid 19 virus 11/05/2020 Pt is not on Xolair Off Singulair since December 2021  Treatment with pred taper x 2 ( 11/05/2020 and 11/15/2020) Plan CXR today We will call you with results I have  sent in a prescription for Hycodan. Take 5 cc at bedtime  Do not drive if sleepy You can use Delsym and Robitussin when you are at work for cough. We will refill your Dulera, Singulair , and nebs. We will need to get your restarted on your Xolair. Please let 01/13/2021 know when you have your new insurance so we can get this approved.  Continue Singulair 10 mg daily  Continue Dulera as you have been doing Rinse mouth after use Continue using albuterol nebs as you have been doing. Follow up in 3 months with Dr. [place] or Vassie Loll NP Please contact office for sooner follow up if symptoms do not improve or worsen or seek emergency care   Seek emergency care for shortness of breath that does not quickly resolve with you Asthma regimen.     Maralyn Sago, NP 11/20/2020  2:28 PM

## 2020-11-27 ENCOUNTER — Encounter: Payer: Self-pay | Admitting: *Deleted

## 2020-11-27 NOTE — Progress Notes (Signed)
Please let patient know her CXR was clear. Thanks

## 2020-12-11 NOTE — Telephone Encounter (Signed)
Sarah, Please see comments and advise.  Thank you.

## 2020-12-12 ENCOUNTER — Other Ambulatory Visit: Payer: Self-pay

## 2020-12-12 ENCOUNTER — Ambulatory Visit: Payer: BC Managed Care – PPO | Admitting: Internal Medicine

## 2020-12-12 DIAGNOSIS — R059 Cough, unspecified: Secondary | ICD-10-CM | POA: Diagnosis not present

## 2020-12-12 DIAGNOSIS — J101 Influenza due to other identified influenza virus with other respiratory manifestations: Secondary | ICD-10-CM | POA: Diagnosis not present

## 2020-12-12 NOTE — Progress Notes (Unsigned)
Alexis Duffy, female    DOB: 04-04-1971, 50 y.o.   MRN: [id number]   Brief patient profile:   50 y.o.femalenever smokerwith history ofsevere persistent atopic asthma with history of multiple positive skin tests. 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 She had a lapse in therapy 05/2017, but had resumed therapy2019, but stooed and  has not had therapy since.She is followed by Dr. Vassie Loll.  She is fully vaccinated, booster Nov 2021   11/20/2020 Follow up Covid +  / Asthma Flare Pt. Presents for follow up.She was seen for a tele visit 11/06/2020. At that time she had been diagnosed with Covid 19, and was having a secondary flare of her asthma. Pt. Called the office 11/05/2020 confirming she had been tested for Covid and was found to be positive. Symptoms started 7 days prior, initially with congestion.No fever, only cough, congestion, wheezing, and chest tightness. She initially thought this was her asthma bronchitis flaring, our office then suggested she test for Covid, which she did. She was surprised to find she was +. 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 She was experiencing  cough and wheezing. She was using her Dulera. She had been out of her Singulair since the beginning of December, and had not been using it.Marland Kitchen She was  using her nebs as needed for breakthrough wheezing and shortness of breath. She was beyond the 5 day window for PO antivirals. She was at the 7 day mark for Monoclonal antibody. She was experiencing  thick brown secretions.She understood that she needed to seek emergency care for shortness of breath that did not  resolve with her rescue medications.  Dr. Vassie Loll sent in a prescription for a pred taper 11/05/2020 : Prednisone 10 mg tabs Take 2 tabs daily with food x 5ds, then 1 tab daily with food x 5ds then STOP, and requested a virtual visit. She started her taper 11/05/2020.   She was seen  virtually 11/06/2020. for continued symptoms not resolved by the prednisone taper. At that time we prescribed her with Levaquin,and cough syrup with Codeine. Plan was for a follow up once she was cleared from [place] with CXR. She did have to call the office for a longer prednisone taper on 11/15/2020. She was prescribed Prednisone 40mg  once daily x 3 days, then 30mg  once daily x 3 days, then 20mg  once daily x 3 days, then prednisone 10mg  once daily  x 3 days and stop.   She presented 11/20/20  today for the follow up appointment with CXR. She states she completed both the Levaquin and prednisone taper x 2. She continues to have a cough. She states secretions are no longer brown, but are a whitish yellow. She states she has been using the nebs every 4 hours when she is at home  which has been helping. She went back to work Saturday 2/22, 2/14, and 2/15. She has been taking her magnesium and zinc. She does have some right rib pain. She does still have a cough. She was actually taken the prednisone as 50 mg daily x 5 day. She did complete her Levaquin. Secretions are no longer brown. They are white to yellow.   We will send in cough syrup as she was unable to get the medication as it was on back order. She has had wheezing with exertion at work.  rec  prescription for Hycodan. Take 5 cc at bedtime  Do not drive if sleepy You can use Delsym and Robitussin when you are at work for cough. We will refill your Dulera, Singulair , and nebs. We will need to get your restarted on your Xolair. Please let us know when you have your new insurance so we can get this approved.  Continue Singulair 10 mg daily  Continue Dulera as you have been doing Rinse mouth after use Continue using albuterol nebs as you have been doing.   History of Present Illness  12/12/2020  Pulmonary/ 1st office eval/ Alexis Duffy / [place] [facility]  No chief complaint on file.    Dyspnea:  *** Cough: *** Sleep: *** SABA use:   Past  Medical History:  Diagnosis Date  . ADHD (attention deficit hyperactivity disorder)   . Allergic rhinitis   . Asthma   . Hypothyroid   . Low testosterone     Outpatient Medications Prior to Visit  Medication Sig Dispense Refill  . albuterol (PROAIR HFA) 108 (90 Base) MCG/ACT inhaler INHALE TWO PUFFS INTO LUNGS EVERY 4 HOURS AS NEEDED FOR WHEEZING OR SHORTNESS OF BREATH 8 g 3  . albuterol (PROVENTIL HFA;VENTOLIN HFA) 108 (90 Base) MCG/ACT inhaler Inhale 2 puffs into the lungs every 4 (four) hours as needed for wheezing or shortness of breath. 1 Inhaler 4  . albuterol (PROVENTIL) (2.5 MG/3ML) 0.083% nebulizer solution Take 3 mLs (2.5 mg total) by nebulization every 4 (four) hours as needed for wheezing or shortness of breath. 120 mL 3  . ALPRAZolam (XANAX) 0.5 MG tablet Take 0.5 mg by mouth 2 (two) times daily as needed for anxiety.    Marland Kitchen amphetamine-dextroamphetamine (ADDERALL) 20 MG tablet Take 20 mg by mouth 3 (three) times daily.    . famotidine (PEPCID) 20 MG tablet Take 1 tablet (20 mg total) by mouth 2 (two) times daily. 30 tablet 3  . fluticasone (FLONASE) 50 MCG/ACT nasal spray Place 2 sprays into both nostrils daily. 16 g 3  . HYDROcodone-homatropine (HYCODAN) 5-1.5 MG/5ML syrup Take 5 mLs by mouth at bedtime as needed for cough. 120 mL 0  . levofloxacin (LEVAQUIN) 500 MG tablet Take 1 tablet (500 mg total) by mouth daily. 5 tablet 0  . mometasone-formoterol (DULERA) 200-5 MCG/ACT AERO Inhale 2 puffs into the lungs 2 (two) times daily. 13 g 3  . montelukast (SINGULAIR) 10 MG tablet TAKE 1 TABLET BY MOUTH AT BEDTIME . APPOINTMENT REQUIRED FOR FUTURE REFILLS 30 tablet 11  . XOLAIR 150 MG injection INJECT 300 MG UNDER THE SKIN EVERY 2 WEEKS 4 vial 5   No facility-administered medications prior to visit.     Objective:     There were no vitals taken for this visit.      I personally reviewed images and agree with radiology impression as follows:  CXR:   11/20/20  PA and  lateral Lungs clear. Cardiac silhouette within normal limits. No adenopathy.    Assessment   No problem-specific Assessment & Plan notes found for this encounter.     Sandrea Hughs, MD 12/12/2020

## 2021-04-01 DIAGNOSIS — Z6823 Body mass index (BMI) 23.0-23.9, adult: Secondary | ICD-10-CM | POA: Diagnosis not present

## 2021-04-01 DIAGNOSIS — Z13220 Encounter for screening for lipoid disorders: Secondary | ICD-10-CM | POA: Diagnosis not present

## 2021-04-01 DIAGNOSIS — E559 Vitamin D deficiency, unspecified: Secondary | ICD-10-CM | POA: Diagnosis not present

## 2021-04-01 DIAGNOSIS — Z0000 Encounter for general adult medical examination without abnormal findings: Secondary | ICD-10-CM | POA: Diagnosis not present

## 2021-06-24 DIAGNOSIS — R635 Abnormal weight gain: Secondary | ICD-10-CM | POA: Diagnosis not present

## 2021-06-24 DIAGNOSIS — R5383 Other fatigue: Secondary | ICD-10-CM | POA: Diagnosis not present

## 2021-06-24 DIAGNOSIS — N951 Menopausal and female climacteric states: Secondary | ICD-10-CM | POA: Diagnosis not present

## 2021-06-30 DIAGNOSIS — Z6826 Body mass index (BMI) 26.0-26.9, adult: Secondary | ICD-10-CM | POA: Diagnosis not present

## 2021-06-30 DIAGNOSIS — R232 Flushing: Secondary | ICD-10-CM | POA: Diagnosis not present

## 2021-06-30 DIAGNOSIS — R6882 Decreased libido: Secondary | ICD-10-CM | POA: Diagnosis not present

## 2021-06-30 DIAGNOSIS — N951 Menopausal and female climacteric states: Secondary | ICD-10-CM | POA: Diagnosis not present

## 2021-06-30 DIAGNOSIS — Z1331 Encounter for screening for depression: Secondary | ICD-10-CM | POA: Diagnosis not present

## 2021-06-30 DIAGNOSIS — Z1339 Encounter for screening examination for other mental health and behavioral disorders: Secondary | ICD-10-CM | POA: Diagnosis not present

## 2021-07-01 ENCOUNTER — Other Ambulatory Visit: Payer: Self-pay | Admitting: Acute Care

## 2021-07-15 ENCOUNTER — Other Ambulatory Visit (HOSPITAL_COMMUNITY): Payer: Self-pay

## 2021-07-15 ENCOUNTER — Telehealth: Payer: Self-pay | Admitting: Acute Care

## 2021-07-15 DIAGNOSIS — J4551 Severe persistent asthma with (acute) exacerbation: Secondary | ICD-10-CM

## 2021-07-15 MED ORDER — MONTELUKAST SODIUM 10 MG PO TABS
10.0000 mg | ORAL_TABLET | Freq: Every day | ORAL | 1 refills | Status: DC
  Filled 2021-07-15 (×2): qty 90, 90d supply, fill #0

## 2021-07-15 MED ORDER — DULERA 200-5 MCG/ACT IN AERO
2.0000 | INHALATION_SPRAY | Freq: Two times a day (BID) | RESPIRATORY_TRACT | 1 refills | Status: DC
  Filled 2021-07-15: qty 13, 30d supply, fill #0

## 2021-07-15 NOTE — Telephone Encounter (Signed)
Call made to patient, confirmed DOB. Made aware of RA recommendations. Patient also requesting scripts for Singulair and Dulera be sent to [facility] since her insurance change she now has to get them at [facility] pharmacy.   Appt made. Medication sent to pharmacy.   Nothing further needed at this time.

## 2021-07-15 NOTE — Telephone Encounter (Signed)
Call made to patient, confirmed DOB. Patient states she now has insurance and would like to go head and start back on xolair.   RA please advise. Would you like [place] to go head and start on paperwork for Xolair or would you like OV first. Last OV 2/20200.   Will cc SG.

## 2021-07-28 ENCOUNTER — Ambulatory Visit: Payer: BC Managed Care – PPO | Admitting: Acute Care

## 2021-09-16 ENCOUNTER — Other Ambulatory Visit (HOSPITAL_COMMUNITY): Payer: Self-pay

## 2021-09-16 DIAGNOSIS — F3342 Major depressive disorder, recurrent, in full remission: Secondary | ICD-10-CM | POA: Diagnosis not present

## 2021-09-16 DIAGNOSIS — F900 Attention-deficit hyperactivity disorder, predominantly inattentive type: Secondary | ICD-10-CM | POA: Diagnosis not present

## 2021-09-16 MED ORDER — ALPRAZOLAM 0.5 MG PO TABS
ORAL_TABLET | ORAL | 2 refills | Status: AC
Start: 2021-09-16 — End: ?
  Filled 2021-09-16: qty 60, 30d supply, fill #0

## 2021-09-16 MED ORDER — ESCITALOPRAM OXALATE 20 MG PO TABS
ORAL_TABLET | Freq: Every day | ORAL | 4 refills | Status: DC
  Filled 2021-09-16: qty 90, 90d supply, fill #0

## 2021-09-16 MED ORDER — BELSOMRA 20 MG PO TABS
ORAL_TABLET | ORAL | 5 refills | Status: DC
  Filled 2021-09-16: qty 30, 30d supply, fill #0

## 2021-09-16 MED ORDER — AMPHETAMINE-DEXTROAMPHETAMINE 20 MG PO TABS
ORAL_TABLET | ORAL | 0 refills | Status: DC
  Filled 2021-09-16: qty 90, 30d supply, fill #0

## 2021-09-17 ENCOUNTER — Other Ambulatory Visit (HOSPITAL_COMMUNITY): Payer: Self-pay

## 2021-09-18 ENCOUNTER — Other Ambulatory Visit (HOSPITAL_COMMUNITY): Payer: Self-pay

## 2021-09-18 MED ORDER — AMPHETAMINE-DEXTROAMPHETAMINE 30 MG PO TABS
ORAL_TABLET | Freq: Two times a day (BID) | ORAL | 0 refills | Status: DC
  Filled 2021-09-18: qty 60, 30d supply, fill #0

## 2021-09-19 ENCOUNTER — Other Ambulatory Visit (HOSPITAL_COMMUNITY): Payer: Self-pay

## 2021-10-04 IMAGING — DX DG CHEST 2V
2 series · 2 of 2 positions shown · non-contrast
Comparison: October 19, 2016

CLINICAL DATA: Recent DPEV9-AD positive

EXAM:
CHEST - 2 VIEW

[chest pa]
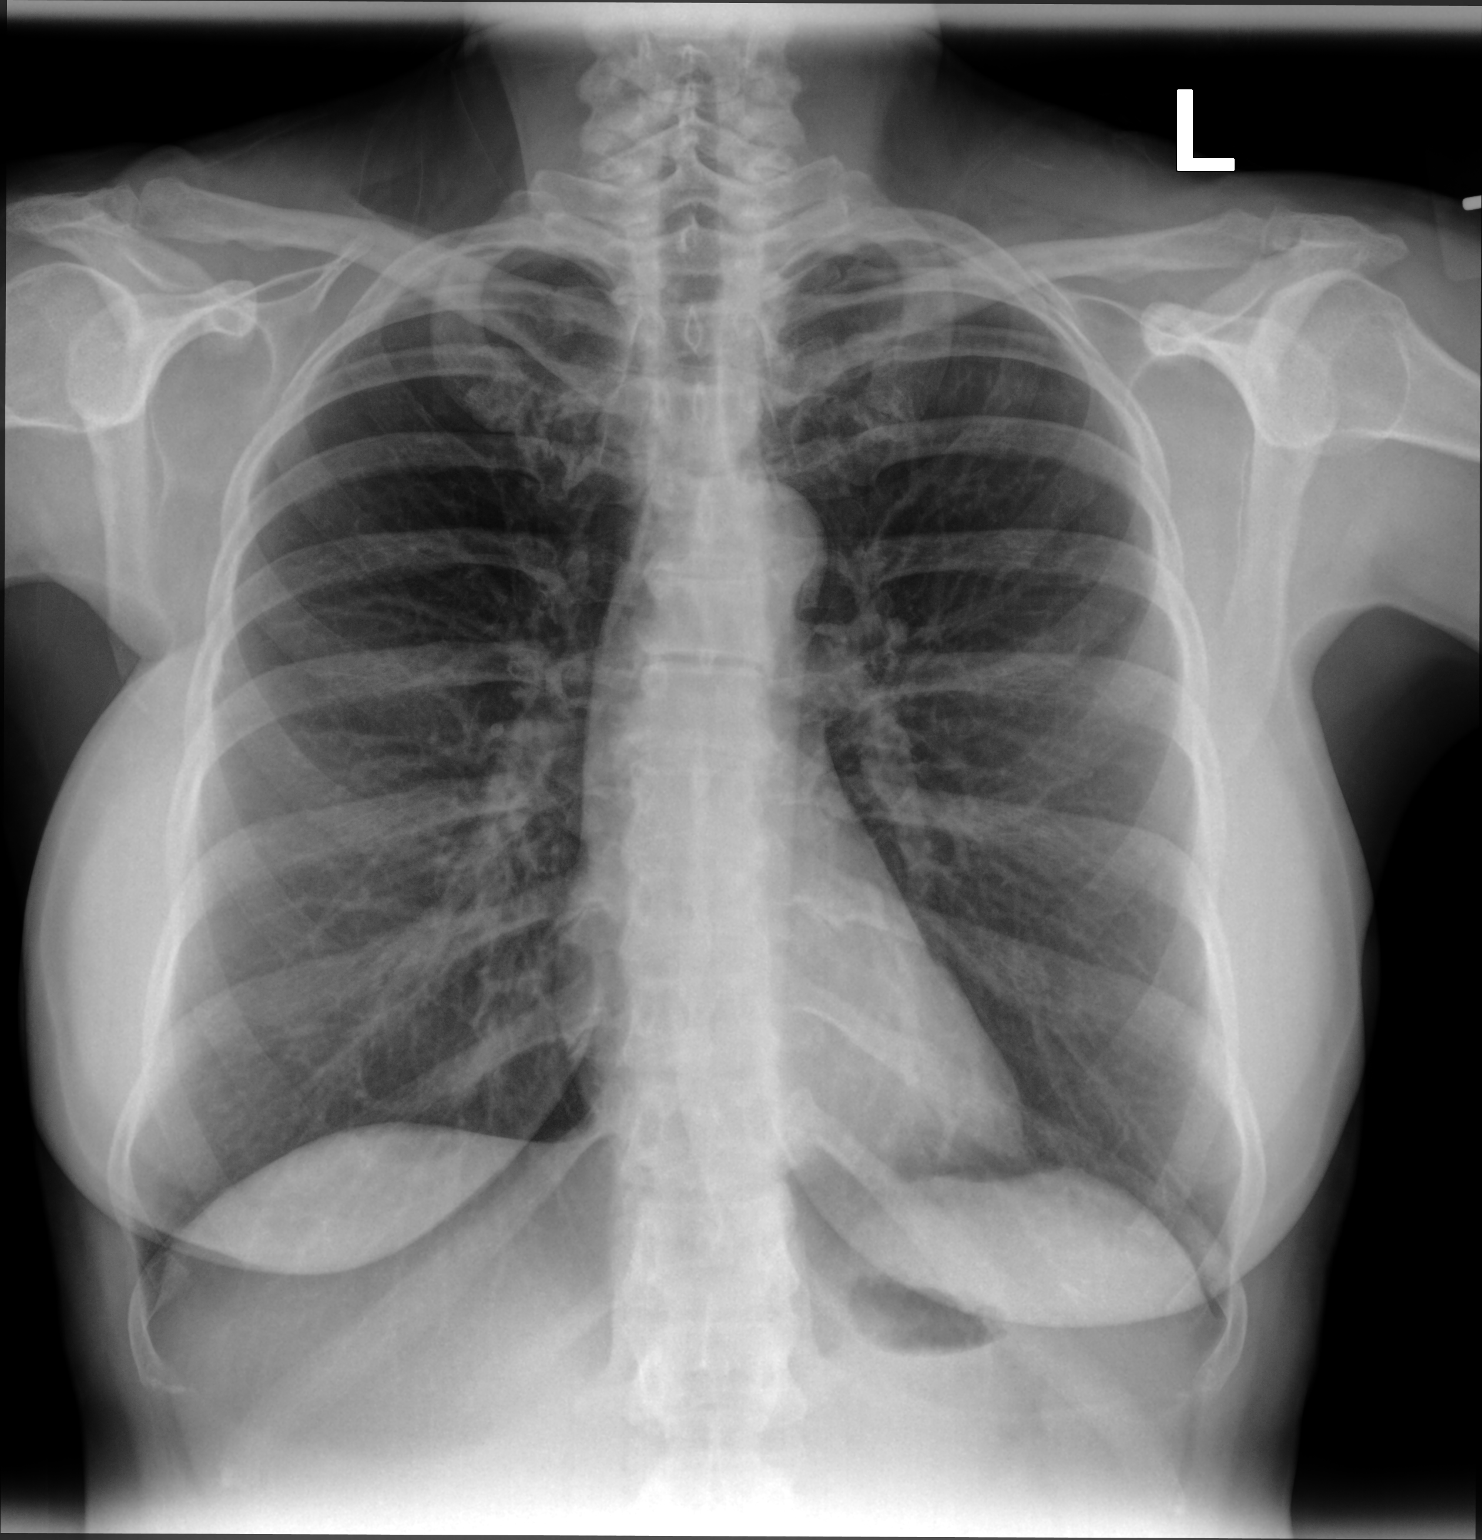

[chest lat]
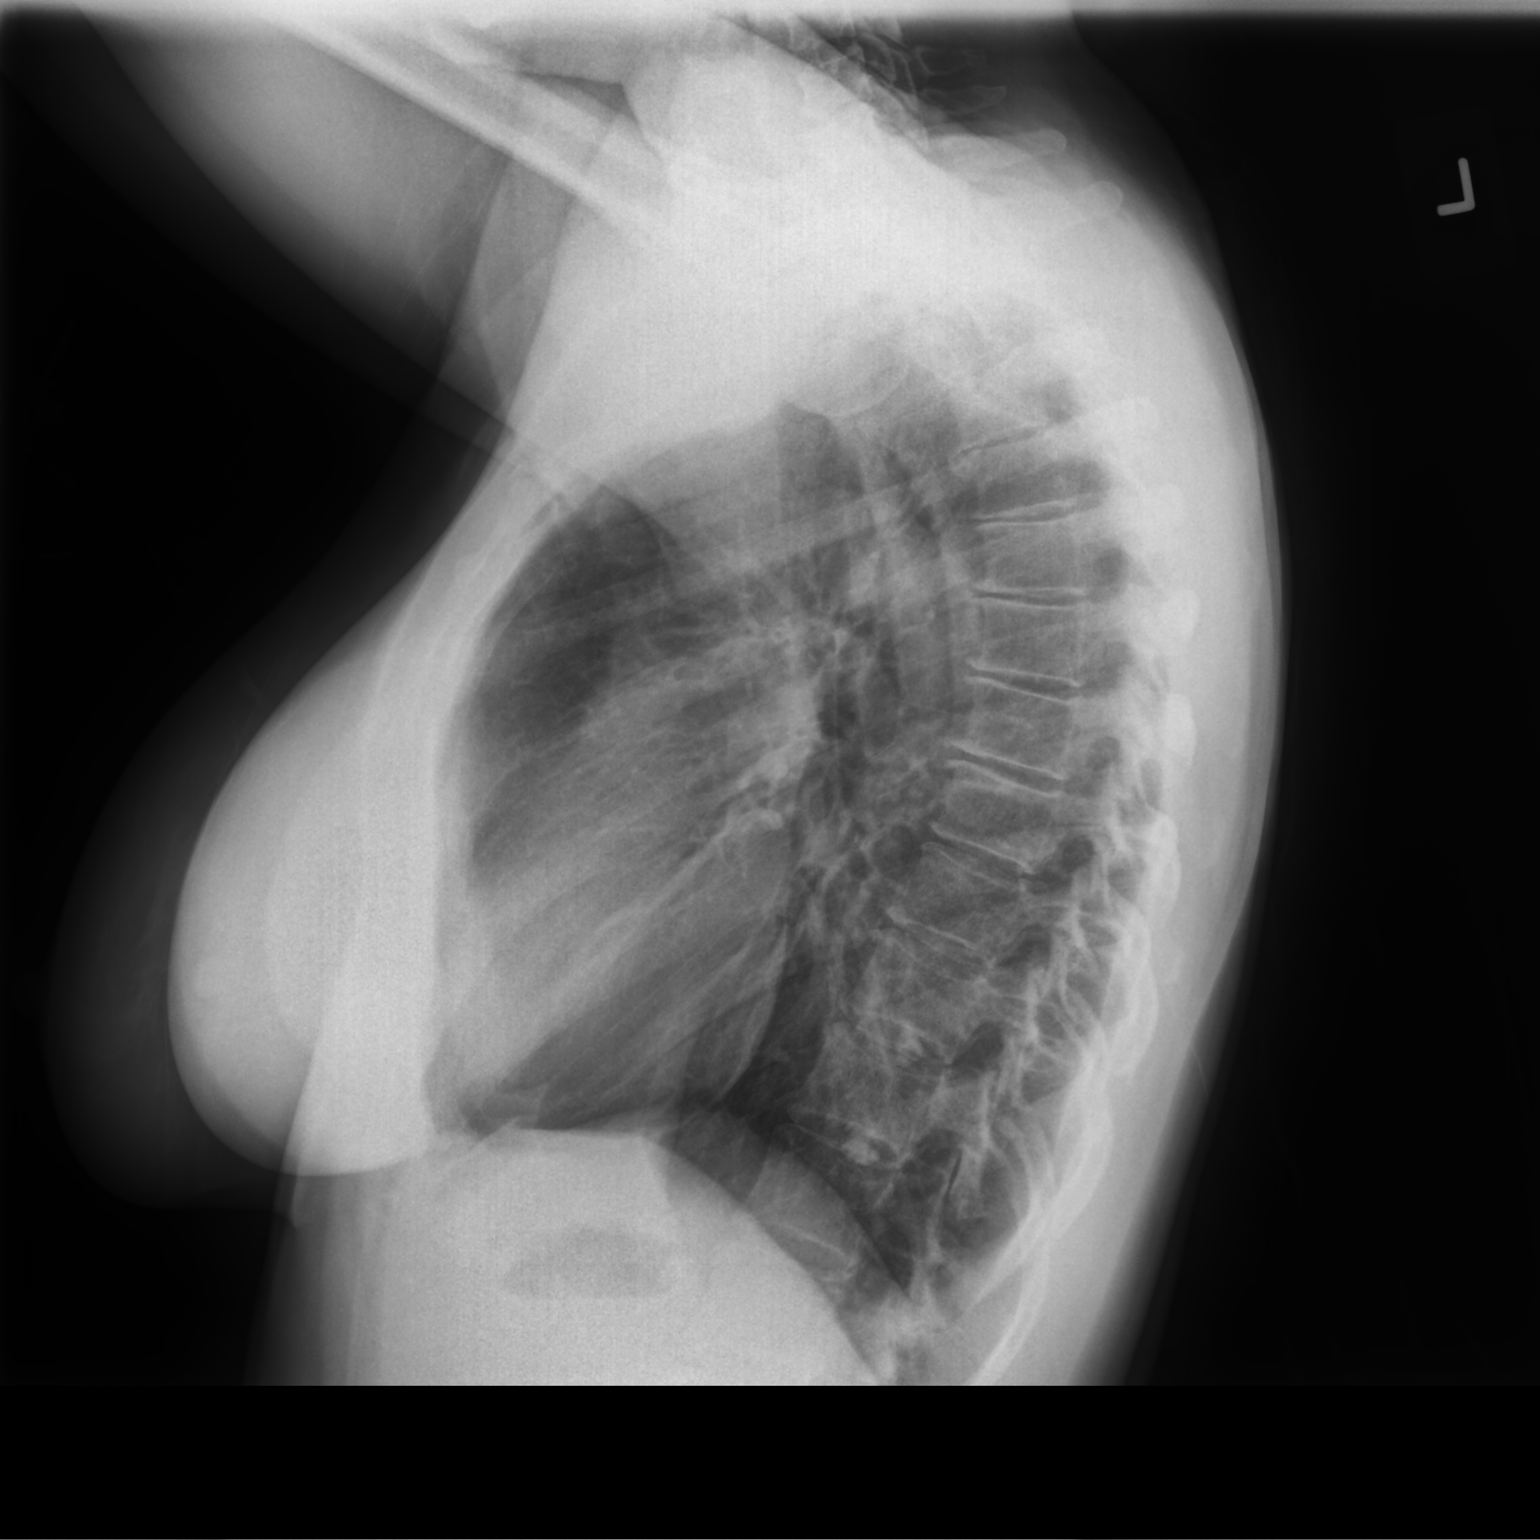

[2 of 2 positions shown; findings below may reference images not displayed]

FINDINGS: The lungs are clear. The heart size and pulmonary vascularity are
normal. No adenopathy. No bone lesions.
IMPRESSION: Lungs clear. Cardiac silhouette within normal limits. No adenopathy.

## 2021-10-21 ENCOUNTER — Other Ambulatory Visit (HOSPITAL_COMMUNITY): Payer: Self-pay

## 2021-10-21 MED ORDER — AMPHETAMINE-DEXTROAMPHETAMINE 30 MG PO TABS
1.0000 | ORAL_TABLET | Freq: Two times a day (BID) | ORAL | 0 refills | Status: DC
  Filled 2021-10-21: qty 60, 30d supply, fill #0

## 2021-10-25 ENCOUNTER — Other Ambulatory Visit (HOSPITAL_COMMUNITY): Payer: Self-pay

## 2021-11-20 ENCOUNTER — Other Ambulatory Visit (HOSPITAL_COMMUNITY): Payer: Self-pay

## 2021-11-20 MED ORDER — AMPHETAMINE-DEXTROAMPHETAMINE 30 MG PO TABS
ORAL_TABLET | Freq: Two times a day (BID) | ORAL | 0 refills | Status: DC
  Filled 2021-11-20: qty 60, 30d supply, fill #0

## 2021-11-21 ENCOUNTER — Other Ambulatory Visit (HOSPITAL_COMMUNITY): Payer: Self-pay

## 2021-12-09 DIAGNOSIS — J4540 Moderate persistent asthma, uncomplicated: Secondary | ICD-10-CM | POA: Diagnosis not present

## 2021-12-09 DIAGNOSIS — I159 Secondary hypertension, unspecified: Secondary | ICD-10-CM | POA: Diagnosis not present

## 2021-12-09 DIAGNOSIS — F988 Other specified behavioral and emotional disorders with onset usually occurring in childhood and adolescence: Secondary | ICD-10-CM | POA: Diagnosis not present

## 2021-12-11 ENCOUNTER — Telehealth: Payer: Self-pay | Admitting: Acute Care

## 2021-12-11 NOTE — Telephone Encounter (Signed)
Pt is coughing, headache, dizzniess, SOB and wheezing since last week. Went to urgent care and they prescribed antibiotics and taper dose of steriods but it doesnt seem to be helping. Scheduled appt with SG on 3/17 as that was her first available. Pharmacy is [facility] in [place] if we need to call anything in. Please advise.  ?

## 2021-12-11 NOTE — Telephone Encounter (Signed)
Called patient but she did not answer. Left message for her to call back.  

## 2021-12-19 ENCOUNTER — Other Ambulatory Visit: Payer: Self-pay

## 2021-12-19 ENCOUNTER — Other Ambulatory Visit (HOSPITAL_COMMUNITY): Payer: Self-pay

## 2021-12-19 ENCOUNTER — Ambulatory Visit: Payer: 59 | Admitting: Acute Care

## 2021-12-19 ENCOUNTER — Telehealth: Payer: Self-pay | Admitting: Acute Care

## 2021-12-19 ENCOUNTER — Encounter: Payer: Self-pay | Admitting: Acute Care

## 2021-12-19 ENCOUNTER — Ambulatory Visit (INDEPENDENT_AMBULATORY_CARE_PROVIDER_SITE_OTHER): Payer: 59

## 2021-12-19 VITALS — BP 124/88 | HR 85 | Temp 98.2°F | Ht 63.0 in | Wt 138.0 lb

## 2021-12-19 DIAGNOSIS — R059 Cough, unspecified: Secondary | ICD-10-CM | POA: Diagnosis not present

## 2021-12-19 DIAGNOSIS — J4541 Moderate persistent asthma with (acute) exacerbation: Secondary | ICD-10-CM

## 2021-12-19 DIAGNOSIS — J069 Acute upper respiratory infection, unspecified: Secondary | ICD-10-CM

## 2021-12-19 DIAGNOSIS — J4551 Severe persistent asthma with (acute) exacerbation: Secondary | ICD-10-CM | POA: Diagnosis not present

## 2021-12-19 MED ORDER — ALBUTEROL SULFATE HFA 108 (90 BASE) MCG/ACT IN AERS
2.0000 | INHALATION_SPRAY | RESPIRATORY_TRACT | 4 refills | Status: DC | PRN
  Filled 2021-12-19: qty 18, 16d supply, fill #0
  Filled 2022-06-05: qty 6.7, 16d supply, fill #1

## 2021-12-19 MED ORDER — METHYLPREDNISOLONE ACETATE 40 MG/ML IJ SUSP: 40.0000 mg | Freq: Once | INTRAMUSCULAR | Status: DC

## 2021-12-19 MED ORDER — MONTELUKAST SODIUM 10 MG PO TABS
10.0000 mg | ORAL_TABLET | Freq: Every day | ORAL | 1 refills | Status: DC
  Filled 2021-12-19: qty 90, 90d supply, fill #0
  Filled 2022-03-13: qty 90, 90d supply, fill #1

## 2021-12-19 MED ORDER — AMOXICILLIN-POT CLAVULANATE 875-125 MG PO TABS
1.0000 | ORAL_TABLET | Freq: Two times a day (BID) | ORAL | 0 refills | Status: DC
  Filled 2021-12-19: qty 14, 7d supply, fill #0

## 2021-12-19 MED ORDER — DULERA 200-5 MCG/ACT IN AERO
2.0000 | INHALATION_SPRAY | Freq: Two times a day (BID) | RESPIRATORY_TRACT | 1 refills | Status: DC
  Filled 2021-12-19: qty 13, 30d supply, fill #0

## 2021-12-19 MED ORDER — PREDNISONE 10 MG PO TABS
ORAL_TABLET | ORAL | 0 refills | Status: DC
  Filled 2021-12-19: qty 50, 20d supply, fill #0

## 2021-12-19 NOTE — Telephone Encounter (Signed)
Called and spoke with patient. She stated that she would like to have the paperwork mailed to her. I confirmed the address on file. Paperwork will be mailed to her.  ? ? ?

## 2021-12-19 NOTE — Telephone Encounter (Signed)
Please send patient the Xolair paperwork to complete to submit to pharmacy. She has insurance now and would like to resume therapy. Thanks so much ?

## 2021-12-19 NOTE — Progress Notes (Signed)
? ?History of Present Illness ?Alexis Duffy is a 51 y.o. female with  never smoker  with history of severe persistent atopic asthma with history of multiple positive skin tests. 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 She had a lapse in therapy 05/2017, but had resumed therapy 2019 for a short time , but  has not had biologic therapy since. She is followed by Dr. Elsworth Soho.   ? ? ?12/19/2021 ?Pt. Presents for acute slow to resolve asthma exacerbation . Started feeling sick  12/02/2021 after her grandchildren had been sick. She had chest tightness, fever and purulent secretions. She initially went to her PCP 12/04/2021 who treated her with a prednisone taper and a z pack for what was noted to be a sinus infection. She completed treatment with the antibiotic 12/08/2021 and she completed the prednisone taper  12/15/2021. She continues to have  Productive cough white to dark tan. Secretions, chest tightness especially on the right, a harsh barking cough, wheezing, cold sweats, and elevated BP.  She has been compliant with her Ruthe Mannan and her Sigulair. She has had increased use of her nebulized treatments . We discussed that she had good control with her Xolair. She has just recently obtained insurance through [facility] and she would like to complete paperwork to get this restarted as she has insurance now.  ?  ?Test Results: ?FENO >> 45 PPB ? ?CXR 12/19/2021  ?Stable chest without evidence of acute cardiopulmonary process. Mild ?chronic central airway thickening. ?  ? ?No flowsheet data found. ? ?No flowsheet data found. ? ?BNP ?No results found for: BNP ? ?ProBNP ?No results found for: PROBNP ? ?PFT ?No results found for: FEV1PRE, FEV1POST, FVCPRE, FVCPOST, TLC, DLCOUNC, PREFEV1FVCRT, PSTFEV1FVCRT ? ?No results found. ? ? ?Past medical hx ?Past Medical History:  ?Diagnosis Date  ? ADHD (attention deficit hyperactivity disorder)   ? Allergic rhinitis   ? Asthma   ?  Hypothyroid   ? Low testosterone   ?  ? ?Social History  ? ?Tobacco Use  ? Smoking status: Never  ? Smokeless tobacco: Never  ?Substance Use Topics  ? Alcohol use: Yes  ?  Comment: 1-2 drinks per month  ? Drug use: No  ? ? ?Ms.Spangler reports that she has never smoked. She has never used smokeless tobacco. She reports current alcohol use. She reports that she does not use drugs. ? ?Tobacco Cessation: ?Never smoker  ? ? ?Past surgical hx, Family hx, Social hx all reviewed. ? ?Current Outpatient Medications on File Prior to Visit  ?Medication Sig  ? albuterol (PROVENTIL HFA;VENTOLIN HFA) 108 (90 Base) MCG/ACT inhaler Inhale 2 puffs into the lungs every 4 (four) hours as needed for wheezing or shortness of breath.  ? albuterol (PROVENTIL) (2.5 MG/3ML) 0.083% nebulizer solution Take 3 mLs (2.5 mg total) by nebulization every 4 (four) hours as needed for wheezing or shortness of breath.  ? amphetamine-dextroamphetamine (ADDERALL) 30 MG tablet Take 1 tablet by mouth twice daily  ? famotidine (PEPCID) 20 MG tablet Take 1 tablet (20 mg total) by mouth 2 (two) times daily.  ? fluticasone (FLONASE) 50 MCG/ACT nasal spray Place 2 sprays into both nostrils daily.  ? metoprolol tartrate (LOPRESSOR) 25 MG tablet Take 25 mg by mouth 2 (two) times daily.  ? mometasone-formoterol (DULERA) 200-5 MCG/ACT AERO Inhale 2 puffs into the lungs 2 (two) times daily.  ? montelukast (SINGULAIR) 10 MG tablet  Take 1 tablet (10 mg total) by mouth at bedtime. Appointment required for future refills  ? XOLAIR 150 MG injection INJECT 300 MG UNDER THE SKIN EVERY 2 WEEKS  ? albuterol (VENTOLIN HFA) 108 (90 Base) MCG/ACT inhaler INHALE 2 PUFFS INTO LUNGS EVERY 4 HOURS AS NEEDED FOR WHEEZING OR SHORTNESS OF BREATH (Patient not taking: Reported on 12/19/2021)  ? ALPRAZolam (XANAX) 0.5 MG tablet Take 0.5 mg by mouth 2 (two) times daily as needed for anxiety. (Patient not taking: Reported on 12/19/2021)  ? ALPRAZolam (XANAX) 0.5 MG tablet Take one tablet by  mouth twice daily if needed. (Patient not taking: Reported on 12/19/2021)  ? amphetamine-dextroamphetamine (ADDERALL) 20 MG tablet Take 20 mg by mouth 3 (three) times daily. (Patient not taking: Reported on 12/19/2021)  ? amphetamine-dextroamphetamine (ADDERALL) 30 MG tablet Take 1 tablet by mouth twice daily (Patient not taking: Reported on 12/19/2021)  ? escitalopram (LEXAPRO) 20 MG tablet Take one tablet by mouth every day. (Patient not taking: Reported on 12/19/2021)  ? HYDROcodone-homatropine (HYCODAN) 5-1.5 MG/5ML syrup Take 5 mLs by mouth at bedtime as needed for cough. (Patient not taking: Reported on 12/19/2021)  ? levofloxacin (LEVAQUIN) 500 MG tablet Take 1 tablet (500 mg total) by mouth daily. (Patient not taking: Reported on 12/19/2021)  ? Suvorexant (BELSOMRA) 20 MG TABS Take 1 tablet by mouth at bedtime. (Patient not taking: Reported on 12/19/2021)  ? ?No current facility-administered medications on file prior to visit.  ?  ? ?Allergies  ?Allergen Reactions  ? Asa [Aspirin]   ?  Increased SOB  ? ? ?Review Of Systems: ? ?Constitutional:   No  weight loss, night sweats,  + Fevers, + chills, fatigue, or  lassitude. ? ?HEENT:   No headaches,  Difficulty swallowing,  Tooth/dental problems, or  Sore throat,  ?              No sneezing, itching, ear ache, nasal congestion, post nasal drip,  ? ?CV:  No chest pain,  Orthopnea, PND, swelling in lower extremities, anasarca, dizziness, palpitations, syncope.  ? ?GI  No heartburn, indigestion, abdominal pain, nausea, vomiting, diarrhea, change in bowel habits, loss of appetite, bloody stools.  ? ?Resp: + shortness of breath with exertion or at rest.  + excess mucus, + productive cough,  + non-productive cough,  No coughing up of blood.  + change in color of mucus.  + wheezing.  No chest wall deformity ? ?Skin: no rash or lesions. ? ?GU: no dysuria, change in color of urine, no urgency or frequency.  No flank pain, no hematuria  ? ?MS:  No joint pain or swelling.  No  decreased range of motion.  No back pain. ? ?Psych:  No change in mood or affect. No depression or anxiety.  No memory loss. ? ? ?Vital Signs ?BP 124/88 (BP Location: Left Arm, Patient Position: Sitting, Cuff Size: Normal)   Pulse 85   Temp 98.2 ?F (36.8 ?C) (Oral)   Ht 5\' 3"  (1.6 m)   Wt 138 lb (62.6 kg)   SpO2 100%   BMI 24.45 kg/m?  ? ? ?Physical Exam: ? ?General- No distress,  A&Ox3, pleasant  ?ENT: No sinus tenderness, TM clear, pale nasal mucosa, no oral exudate,no post nasal drip, no LAN ?Cardiac: S1, S2, regular rate and rhythm, no murmur ?Chest: No wheeze/ rales/ dullness; no accessory muscle use, no nasal flaring, no sternal retractions, dimiished per bases  ?Abd.: Soft Non-tender, ND, BS +, Body mass index is 24.45 kg/m?Marland Kitchen  ?  Ext: No clubbing cyanosis, edema ?Neuro:  normal strength, MAE x 4, A&O x 3 ?Skin: No rashes, warm and dry, no lesions  ?Psych: normal mood and behavior ? ? ?Assessment/Plan ? ?Slow to resolve asthma exacerbation  ?Failed initial treatment with  z pack and prednisone taper ?Plan ?We will renew your Dulera, Singulair and Albuterol  ?Use as directed ?CXR now ?We will call you with results ?We will check a FENO.>> This was 45 ?DepoMedrol 40 mg IM now ?Prednisone taper; 10 mg tablets: 4 tabs x 5 days, 3 tabs x 5 days, 2 tabs x 5 days 1 tab x 5 days then stop.  ?Start taper 12/19/2021 ?Augmentin 875/125 take one tablet twice daily x 7 days  ?Please take probiotics with antibiotics.  ?Use albuterol treatments as needed.  ?We will send you paperwork to get Xolair restarted for your severe asthma ?Seek emergency care if needed. ?Follow up in 2 weeks with Judson Roch NP to ensure you are better.  ? ?I spent 45 minutes dedicated to the care of this patient on the date of this encounter to include pre-visit review of records, face-to-face time with the patient discussing conditions above, post visit ordering of testing, clinical documentation with the electronic health record, making appropriate  referrals as documented, and communicating necessary information to the patient's healthcare team.  ? ? ?Magdalen Spatz, NP ?12/19/2021  11:34 AM ? ? ? ? ? ? ? ? ? ?

## 2021-12-19 NOTE — Patient Instructions (Addendum)
We will renew your Dulera, Singulair and Albuterol  ?Use as directed ?CXR now ?We will call you with results ?We will check a FENO.>> This was 45 ?DepoMedrol 40 mg IM now ?Prednisone taper; 10 mg tablets: 4 tabs x 5 days, 3 tabs x 5 days, 2 tabs x 5 days 1 tab x 5 days then stop.  ?Start taper 12/19/2021 ?Augmentin 875/125 take one tablet twice daily x 7 days  ?Please take probiotics with antibiotics.  ?Use albuterol treatments as needed.  ?We will send you paperwork to get Xolair restarted for your severe asthma ?Seek emergency care if needed. ?Follow up in 2 weeks with Maralyn Sago NP to ensure you are better.  ? ? ?

## 2021-12-26 ENCOUNTER — Other Ambulatory Visit (HOSPITAL_COMMUNITY): Payer: Self-pay

## 2021-12-29 ENCOUNTER — Other Ambulatory Visit (HOSPITAL_COMMUNITY): Payer: Self-pay

## 2021-12-29 MED ORDER — AMPHETAMINE-DEXTROAMPHETAMINE 30 MG PO TABS
ORAL_TABLET | Freq: Two times a day (BID) | ORAL | 0 refills | Status: DC
  Filled 2021-12-29: qty 60, 30d supply, fill #0

## 2021-12-31 ENCOUNTER — Other Ambulatory Visit (HOSPITAL_COMMUNITY): Payer: Self-pay

## 2022-01-02 ENCOUNTER — Ambulatory Visit: Payer: 59 | Admitting: Acute Care

## 2022-01-02 ENCOUNTER — Other Ambulatory Visit (HOSPITAL_COMMUNITY): Payer: Self-pay

## 2022-01-03 ENCOUNTER — Other Ambulatory Visit (HOSPITAL_COMMUNITY): Payer: Self-pay

## 2022-01-05 ENCOUNTER — Other Ambulatory Visit (HOSPITAL_COMMUNITY): Payer: Self-pay

## 2022-01-05 ENCOUNTER — Telehealth: Payer: Self-pay

## 2022-01-05 NOTE — Telephone Encounter (Signed)
Patient Advocate Encounter ?  ?Received notification from [facility] that prior authorization for Dulera 200-74mcg is required by his/her insurance [employer]. ?  ?PA submitted on 01/05/22 ? ?Key#: [id number] ? ?Status is pending ?   ?[facility] will continue to follow: ? ?Patient Advocate ?Fax: [phone]  ?

## 2022-01-06 ENCOUNTER — Telehealth: Payer: Self-pay

## 2022-01-06 DIAGNOSIS — J4551 Severe persistent asthma with (acute) exacerbation: Secondary | ICD-10-CM

## 2022-01-06 DIAGNOSIS — J4541 Moderate persistent asthma with (acute) exacerbation: Secondary | ICD-10-CM

## 2022-01-06 NOTE — Telephone Encounter (Signed)
Received New start paperwork for [facility]. Will update as we work through the benefits process. Per separate encounter, pt's portion has been mailed to her. ? ? ?Submitted a Prior Authorization request to [facility] for XOLAIR via CoverMyMeds. Will update once we receive a response. ? ? ?Key: [id number] ?

## 2022-01-06 NOTE — Telephone Encounter (Signed)
Received fax from [employer] requesting additional information be sent in regards to [facility] PA. Due date for response is 02/19/2022.  ? ?2-page form has been faxed to PA Team for f/u. ?

## 2022-01-08 ENCOUNTER — Other Ambulatory Visit (HOSPITAL_COMMUNITY): Payer: Self-pay

## 2022-01-08 MED ORDER — OMALIZUMAB 150 MG/ML ~~LOC~~ SOSY
300.0000 mg | PREFILLED_SYRINGE | SUBCUTANEOUS | 0 refills | Status: DC
  Filled 2022-01-08: qty 2, 28d supply, fill #0

## 2022-01-08 NOTE — Telephone Encounter (Signed)
Rx for Xolair 300mg  SQ very 4 weeks sent to [facility] to be couriered to clinic before 01/15/22.  ? ?ATC patient to schedule visit to get re-started on Xolair for self-admin. Unable to reach. Left VM with direct number for r/s. Will f/u  ? ?01/17/22, PharmD, MPH, BCPS ?Clinical Pharmacist (Rheumatology and Pulmonology) ?

## 2022-01-08 NOTE — Telephone Encounter (Signed)
Received notification from [facility] regarding a prior authorization for XOLAIR. Authorization has been APPROVED for 4 FILLS from 01/08/2022 to 05/08/2022. Approval letter sent to scan center. ? ?Per test claim, copay for 28 days supply is $832.00 ? ?Patient can fill through [facility]: [phone]  ? ?Authorization # [id number] ?

## 2022-01-08 NOTE — Telephone Encounter (Signed)
Pt enrolled into copay card program: ? ? ? ?Information has been added into [facility]. ?

## 2022-01-09 ENCOUNTER — Other Ambulatory Visit (HOSPITAL_COMMUNITY): Payer: Self-pay

## 2022-01-09 NOTE — Telephone Encounter (Signed)
Delivery instructions have been updated in [facility], medication will be couriered to [facility] by 01/14/22. ? ?Rx has been processed in [facility] and copay card/PAP info has been provided to the pharmacy. Patient will have a copay of $5.00. ?

## 2022-01-10 ENCOUNTER — Other Ambulatory Visit (HOSPITAL_COMMUNITY): Payer: Self-pay

## 2022-01-12 ENCOUNTER — Other Ambulatory Visit (HOSPITAL_COMMUNITY): Payer: Self-pay

## 2022-01-12 NOTE — Telephone Encounter (Signed)
ATC patient to schedule Xolair new start visit. Unable to reach. Left VM advising that first three injections must be in the clinic with first dose being 2 hour appt, second and third doses being 30 minutes each. Patient will also need Epipen at each visit and moving forward while receiving Xolair therapy ? ?Will continue to f/u ? ?Chesley Mires, PharmD, MPH, BCPS ?Clinical Pharmacist (Rheumatology and Pulmonology) ?

## 2022-01-13 ENCOUNTER — Other Ambulatory Visit (HOSPITAL_COMMUNITY): Payer: Self-pay

## 2022-01-13 NOTE — Telephone Encounter (Signed)
Pt was seen for an acute visit after she called the office. Nothing further needed. ?

## 2022-01-14 ENCOUNTER — Other Ambulatory Visit (HOSPITAL_COMMUNITY): Payer: Self-pay

## 2022-01-15 ENCOUNTER — Other Ambulatory Visit (HOSPITAL_COMMUNITY): Payer: Self-pay

## 2022-01-15 MED ORDER — EPINEPHRINE 0.3 MG/0.3ML IJ SOAJ
0.3000 mg | INTRAMUSCULAR | 2 refills | Status: AC | PRN
  Filled 2022-01-15: qty 2, 30d supply, fill #0

## 2022-01-15 NOTE — Telephone Encounter (Addendum)
Patient scheduled to restart Xolair in clinic on 01/22/22. Patient aware of 2 hour monitoring period. ? ?Epipen rx sent to [facility] since patient believes that the one she has at home right now is expired. Patient advised that she must have this on hand at visit ? ?Knox Saliva, PharmD, MPH, BCPS ?Clinical Pharmacist (Rheumatology and Pulmonology) ?

## 2022-01-19 ENCOUNTER — Other Ambulatory Visit (HOSPITAL_COMMUNITY): Payer: Self-pay

## 2022-01-19 NOTE — Telephone Encounter (Signed)
Patient Advocate Encounter ? ?Received notification from [facility] that the request for prior authorization for [facility] 200-75mcg has been denied due to the patient not trying one of the following meds.: Advair HFA, Advair Diskus, Breo Ellipta, or Symbicort HFA. ?  ? ?Specialty Pharmacy Patient Advocate ?Fax: [phone]  ?

## 2022-01-19 NOTE — Telephone Encounter (Signed)
Please advise 

## 2022-01-22 ENCOUNTER — Ambulatory Visit (INDEPENDENT_AMBULATORY_CARE_PROVIDER_SITE_OTHER): Payer: 59 | Admitting: Pharmacist

## 2022-01-22 ENCOUNTER — Other Ambulatory Visit (HOSPITAL_COMMUNITY): Payer: Self-pay

## 2022-01-22 ENCOUNTER — Telehealth: Payer: Self-pay | Admitting: Pharmacist

## 2022-01-22 DIAGNOSIS — Z7189 Other specified counseling: Secondary | ICD-10-CM

## 2022-01-22 DIAGNOSIS — J4551 Severe persistent asthma with (acute) exacerbation: Secondary | ICD-10-CM

## 2022-01-22 MED ORDER — BUDESONIDE-FORMOTEROL FUMARATE 160-4.5 MCG/ACT IN AERO
2.0000 | INHALATION_SPRAY | Freq: Two times a day (BID) | RESPIRATORY_TRACT | 6 refills | Status: DC
  Filled 2022-01-22: qty 10.2, 30d supply, fill #0
  Filled 2022-04-04: qty 10.2, 30d supply, fill #1
  Filled 2022-06-05: qty 10.2, 30d supply, fill #2

## 2022-01-22 MED ORDER — OMALIZUMAB 150 MG/ML ~~LOC~~ SOSY
300.0000 mg | PREFILLED_SYRINGE | SUBCUTANEOUS | 5 refills | Status: DC
  Filled 2022-01-22: qty 2, 28d supply, fill #0

## 2022-01-22 NOTE — Telephone Encounter (Signed)
Patient of Dr. Carlena Sax ? ?If [name] is not covered ? ?Prescription for Advair HFA 230-2 puffs twice daily ? ?Breo 200-1 puff daily ? ?Symbicort HFA 160-2 puffs twice a day ? ?Options that may be sent in for her. ?

## 2022-01-22 NOTE — Patient Instructions (Addendum)
Your next XOLAIR dose is due on 02/19/22, 03/19/22, and every 4 weeks thereafter ? ?CONTINUE SYMBICORT and montelukast as prescribed ? ?Your prescription will be shipped from [facility]. Their phone number is [phone] ?They will call you in about three weeks to schedule shipment and confirm address. They will mail your medication to your home. ? ?You will need to be seen by your provider in 3 to 4 months to assess how [name] is working for you. Please ensure you have a follow-up appointment scheduled in July or August. Call our clinic if you need to make this appointment. ? ?How to manage an injection site reaction: ?Remember the 5 C's: ?COUNTER - leave on the counter at least 30 minutes but up to overnight to bring medication to room temperature. This may help prevent stinging ?COLD - place something cold (like an ice gel pack or cold water bottle) on the injection site just before cleansing with alcohol. This may help reduce pain ?CLARITIN - use Claritin (generic name is loratadine) for the first two weeks of treatment or the day of, the day before, and the day after injecting. This will help to minimize injection site reactions ?CORTISONE CREAM - apply if injection site is irritated and itching ?CALL ME - if injection site reaction is bigger than the size of your fist, looks infected, blisters, or if you develop hives  ? ?Anaphylactic Reactions ? ?Anaphylactic reactions can occur up to 24 hours after administration. ? ?What are the signs and symptoms of anaphylaxis? ? ?Symptoms can vary from a mild skin reaction to more severe reactions including: ?Wheezing, shortness of breath, cough, chest tightness or trouble breathing ?Low blood pressure, dizziness, fainting, rapid of weak heartbeat, anxiety, or feeling of "impending doom" ?Flushing, itching, hives, or feeling warm ?Swelling of the throat or tongue, throat tightness, hoarse voice, or trouble swallowing ? ?Some of these  symptoms require immediate treatment, as they can be life threatening.  If you experience severe symptoms which are bolded above use your Epipen and call 911 for immediate emergency care. ?

## 2022-01-22 NOTE — Progress Notes (Signed)
? ?HPI ?Patient presents today to [facility] to see pharmacy team for Xolair re-start for severe persistent asthma with atopic component. She has multiple positive skin tests. She was previously stable on Xolair but had interruption in therapy due to loss of insurance and hospitalization for pneumonia. Since being off of Xolair and her [facility], she does reports increased SOB and difficulty breathing.  Past medical history includes severe persistent asthma, seasonal and perennial allergic rhinitis, ? ?Epipen on hand and in date: Yes ? ?Her last Xolair dose was at least one year ago. She states she has also been off of maintenance inhaler for 2 months. She picked up Symbicort today from pharmacy ? ?Respiratory Medications ?Current regimen: Symbicort 160/4.68mg (2 puffs twice daily), montelukast 112mnightly ? ?OBJECTIVE ?Allergies  ?Allergen Reactions  ? Asa [Aspirin]   ?  Increased SOB  ? ? ?Outpatient Encounter Medications as of 01/22/2022  ?Medication Sig  ? albuterol (PROVENTIL) (2.5 MG/3ML) 0.083% nebulizer solution Take 3 mLs (2.5 mg total) by nebulization every 4 (four) hours as needed for wheezing or shortness of breath.  ? albuterol (VENTOLIN HFA) 108 (90 Base) MCG/ACT inhaler INHALE 2 PUFFS INTO LUNGS EVERY 4 HOURS AS NEEDED FOR WHEEZING OR SHORTNESS OF BREATH (Patient not taking: Reported on 12/19/2021)  ? albuterol (VENTOLIN HFA) 108 (90 Base) MCG/ACT inhaler Inhale 2 puffs into the lungs every 4 (four) hours as needed for wheezing or shortness of breath.  ? ALPRAZolam (XANAX) 0.5 MG tablet Take 0.5 mg by mouth 2 (two) times daily as needed for anxiety. (Patient not taking: Reported on 12/19/2021)  ? ALPRAZolam (XANAX) 0.5 MG tablet Take one tablet by mouth twice daily if needed. (Patient not taking: Reported on 12/19/2021)  ? amoxicillin-clavulanate (AUGMENTIN) 875-125 MG tablet Take 1 tablet by mouth 2 (two) times daily.  ? amphetamine-dextroamphetamine (ADDERALL) 20 MG tablet Take 20 mg by mouth 3  (three) times daily. (Patient not taking: Reported on 12/19/2021)  ? amphetamine-dextroamphetamine (ADDERALL) 30 MG tablet Take 1 tablet by mouth twice daily (Patient not taking: Reported on 12/19/2021)  ? amphetamine-dextroamphetamine (ADDERALL) 30 MG tablet Take 1 tablet by mouth 2 times a day.  ? budesonide-formoterol (SYMBICORT) 160-4.5 MCG/ACT inhaler Inhale 2 puffs into the lungs in the morning and at bedtime.  ? EPINEPHrine 0.3 mg/0.3 mL IJ SOAJ injection Inject 0.3 mg into the muscle as needed for anaphylaxis.  ? escitalopram (LEXAPRO) 20 MG tablet Take one tablet by mouth every day. (Patient not taking: Reported on 12/19/2021)  ? famotidine (PEPCID) 20 MG tablet Take 1 tablet (20 mg total) by mouth 2 (two) times daily.  ? fluticasone (FLONASE) 50 MCG/ACT nasal spray Place 2 sprays into both nostrils daily.  ? HYDROcodone-homatropine (HYCODAN) 5-1.5 MG/5ML syrup Take 5 mLs by mouth at bedtime as needed for cough. (Patient not taking: Reported on 12/19/2021)  ? levofloxacin (LEVAQUIN) 500 MG tablet Take 1 tablet (500 mg total) by mouth daily. (Patient not taking: Reported on 12/19/2021)  ? metoprolol tartrate (LOPRESSOR) 25 MG tablet Take 25 mg by mouth 2 (two) times daily.  ? montelukast (SINGULAIR) 10 MG tablet Take 1 tablet (10 mg total) by mouth at bedtime. Appointment required for future refills  ? omalizumab (XArvid Right150 MG/ML prefilled syringe Inject 300 mg into the skin every 28 (twenty-eight) days. Courier to pulm: [id number], [place] [id number] on or after 01/15/22  ? predniSONE (DELTASONE) 10 MG tablet Take 4 tablets by mouth for 5 days, 3 tablets for  5 days, 2 tablets for 5 days then 1 tablet for 5 days then stop.  ? Suvorexant (BELSOMRA) 20 MG TABS Take 1 tablet by mouth at bedtime. (Patient not taking: Reported on 12/19/2021)  ? ?Facility-Administered Encounter Medications as of 01/22/2022  ?Medication  ? methylPREDNISolone acetate (DEPO-MEDROL) injection 40 mg  ?   ? ?Immunization History  ?Administered Date(s) Administered  ? Influenza Inj Mdck Quad Pf 07/21/2017  ? Influenza Split 05/05/2013  ? Influenza, Seasonal, Injecte, Preservative Fre 07/12/2014  ? Influenza,inj,Quad PF,6+ Mos 07/12/2014, 07/24/2018  ? Influenza,inj,quad, With Preservative 07/21/2017  ? Influenza-Unspecified 07/03/2015, 07/21/2017, 07/04/2019, 07/31/2020  ? MMR 05/01/2013  ? PFIZER(Purple Top)SARS-COV-2 Vaccination 09/25/2019, 10/16/2019, 08/20/2020  ? PPD Test 05/01/2013, 06/01/2013  ? Pneumococcal Polysaccharide-23 05/05/2013, 06/01/2013  ? Td 07/05/2006  ? Tdap 01/24/2013  ? Varicella 01/24/2013  ? Zoster, Live 05/01/2013  ?  ? ?PFTs ?   ? View : No data to display.  ?  ?  ?  ?  ? ?Eosinophils ?Most recent blood eosinophil count was 100 cells/microL taken on 03/14/20.  ? ?IgE: 473.7 on 08/14/13 ? ?Assessment  ? ?Biologics training for omalizumab (Xolair) ? ?Goals of therapy: ?Mechanism: IgG monoclonal antibody (recombinant DNA derived) which inhibits IgE binding to the high-affinity IgE receptor on mast cells and basophils.  ?Reviewed that Xolair is add-on medication and patient must continue maintenance inhaler regimen. ?Response to therapy: may take 3 to 6 months to determine efficacy. Discussed that patients generally feel improvement sooner than 3 months. ? ?Side effects: anaphylaxis (0.1%) (black boxed warning), injection site reaction (45%), arthralgia (2.9% to 8%), headache (3% to 15%) ? ?Dose: 373m SQ every 4 weeks based on pre-treatment IgE of 473.7Patient advised that each dose will be 2 purple pre-filled syringes ? ?Administration/Storage:  ?Reviewed administration sites of thigh or abdomen (at least 2-3 inches away from abdomen). Reviewed the upper arm is only appropriate if caregiver is administering injection. Patient advised of injection site reaction management. She has been counseled to not self-administer in arm moving forward ?Discussed that injections may take 5 to 10 seconds to  administer (solution is slightly viscous). ?Do not inject into moles, scars, bruises, tender areas, or broken skin. ?Do not shake pre-filled syringe as this could lead to product foaming or precipitation. ?Do not use if solution is discolored or contains particulate matter or if window on prefilled pen is yellow (indicates pen has been used).  ?Reviewed storage of medication in refrigerator. Reviewed that Xolair can be stored at room temperature in unopened carton for up to 4 hours only. ? ?Access: ?Approval of Xolair through: insurance ?Patient enrolled into copay card program to help with copay assistance. ? ?Provider administered in left arm using [facility]-supplied medication; ?Xolair 1532mmL prefilled syringe x 2 syringe = 3006motal dose ?[id number]: [id number]: 10/2022 ? ?Monitored for 2 hours after administed at 1420 and spoke with patient to determine her status every 20 minutes: ?1440 - patient pleasant. No issues reported ?1500 - No issues reported ?1520 - No issues reported by patient ?1540 - No issues reported by patient ?1600 - No issues reported by patient ? ?Medication Reconciliation ? ?A drug regimen assessment was performed, including review of allergies, interactions, disease-state management, dosing and immunization history. Medications were reviewed with the patient, including name, instructions, indication, goals of therapy, potential side effects, importance of adherence, and safe use. ? ?Drug interaction(s): none noted ? ?Immunizations ? ?Patient has received 3 COVID19 vaccines. ?She is UTD on influenza and  pneumonia vaccine. ?Is eligible for inactive zoster vaccine (Shingrix) ? ?PLAN ?She will continue Xolair 369m SQ every 4 weeks. Next dose is due 02/19/22 ?[facility] [phone].  Patient provided with pharmacy phone number and advised that pharmacy will call her in 3 weeks to schedule delivery to her home ?Continue maintenance  asthma regimen of: Symbicort 160/4.567m (2 puffs twice daily), montelukast 1013mightly ? ?All questions encouraged and answered.  Instructed patient to reach out with any further questions or concerns. ? ?Thank you fo

## 2022-01-22 NOTE — Telephone Encounter (Signed)
Called patient to schedule an appointment for the [employer] [facility]. I was unable to reach the patient so I left a HIPAA-compliant message requesting that the patient return my call.   Luke Van Ausdall, PharmD, BCACP, CPP Clinical Pharmacist [facility] [phone]  

## 2022-01-22 NOTE — Addendum Note (Signed)
Addended by: Wyvonne Lenz on: 01/22/2022 10:17 AM ? ? Modules accepted: Orders ? ?

## 2022-01-22 NOTE — Telephone Encounter (Signed)
Called and spoke with pt letting her know info about PA and inhalers that were preferred. Pt verbalized understanding. Have sent Symbicort Rx to preferred pharmacy for pt. Nothing further needed. ?

## 2022-01-23 ENCOUNTER — Ambulatory Visit: Payer: 59 | Admitting: Acute Care

## 2022-01-28 ENCOUNTER — Other Ambulatory Visit (HOSPITAL_COMMUNITY): Payer: Self-pay

## 2022-01-28 MED ORDER — AMPHETAMINE-DEXTROAMPHETAMINE 30 MG PO TABS
30.0000 mg | ORAL_TABLET | Freq: Two times a day (BID) | ORAL | 0 refills | Status: DC
  Filled 2022-01-28: qty 60, 30d supply, fill #0

## 2022-01-29 ENCOUNTER — Other Ambulatory Visit (HOSPITAL_COMMUNITY): Payer: Self-pay

## 2022-02-02 ENCOUNTER — Other Ambulatory Visit (HOSPITAL_COMMUNITY): Payer: Self-pay

## 2022-02-09 ENCOUNTER — Ambulatory Visit: Payer: 59 | Attending: Family Medicine | Admitting: Pharmacist

## 2022-02-09 ENCOUNTER — Other Ambulatory Visit (HOSPITAL_COMMUNITY): Payer: Self-pay

## 2022-02-09 DIAGNOSIS — J4551 Severe persistent asthma with (acute) exacerbation: Secondary | ICD-10-CM

## 2022-02-09 DIAGNOSIS — Z79899 Other long term (current) drug therapy: Secondary | ICD-10-CM

## 2022-02-09 MED ORDER — OMALIZUMAB 150 MG/ML ~~LOC~~ SOSY
300.0000 mg | PREFILLED_SYRINGE | SUBCUTANEOUS | 5 refills | Status: DC
  Filled 2022-02-09: qty 2, 28d supply, fill #0
  Filled 2022-03-05: qty 2, 28d supply, fill #1
  Filled 2022-04-04 – 2022-04-13 (×2): qty 2, 28d supply, fill #2
  Filled 2022-05-06: qty 2, 28d supply, fill #3
  Filled 2022-06-05: qty 2, 28d supply, fill #4
  Filled 2022-07-06: qty 2, 28d supply, fill #5

## 2022-02-09 NOTE — Progress Notes (Signed)
? ?  S: ?Patient presents for review of their specialty medication therapy. ? ?Patient is currently taking Xolair for asthma. Patient is managed by Kandice Robinsons for this.  ? ?Adherence: confirmed ? ?Efficacy: reports that she was on it before with good efficacy but off d/t insurance reasons. She is now restarting it.  ? ?Dosing: Give subcutaneously.  Can be dosed every 2 or 4 weeks based on baseline serum IgE levels and body weight. ? ?Asthma: SubQ: Dose and frequency based on body weight and pretreatment total IgE serum levels. Dosing should be adjusted during therapy for significant changes in body weight. Dosing should not be adjusted based on total IgE levels taken during treatment or <1 year following interruption of therapy. If therapy has been interrupted for ?1 year, total IgE levels may be re-evaluated for dosage determination. ? ?Xolair 300 mg q28days ? ?Dose adjustments: ?Renal: no dose adjustments  ?Hepatic: no dose adjustments  ?Toxicity: Severe hypersensitivity reaction or anaphylaxis: Discontinue treatment. ?Fever, arthralgia, and rash: Discontinue treatment if this constellation of symptoms occurs. ? ?Drug-drug interactions: none identified  ? ?Monitoring: ?CV effects: none ?Eosinophilia and vasculitis: none ?Fever/arthralgia/rash: none ?Hypersensitivity/Anaphylaxis: none ?Malignant neoplasms: none ?Pulmonary function tests (for asthma): none ? ? ?O: ?No results found for: WBC, HGB, HCT, MCV, PLT ? ?  Chemistry   ?No results found for: NA, K, CL, CO2, BUN, CREATININE, GLU No results found for: CALCIUM, ALKPHOS, AST, ALT, BILITOT  ? ? ? ?A/P: ?1. Medication review: patient currently on Xolair for asthma. Reviewed the medication with the patient, including the following: Xolair, omalizumab, is a novel IgE blocker.  It appears to reduce rates of hospitalizations, ER visits and unscheduled physician visits due asthma exacerbations when added to standard therapy.  Studies also show a reduction in steroid  requirements and improvement in quality of life.  Patient educated on purpose, proper use and potential adverse effects of Xolair.  Following instruction patient verbalized understanding. Patient should always have an EpiPen readily available in the event of anaphylaxis. SubQ: For SubQ injection only; doses >150 mg should be divided over more than one injection site (eg, 225 mg or 300 mg administered as two injections, 375 mg administered as three injections); each injection site should be separated by ?1 inch. Do not inject into moles, scars, bruises, tender areas, or broken skin. Injections may take 5 to 10 seconds to administer (solution is slightly viscous). Administer only under direct medical supervision and observe patient for 2 hours after the first 3 injections and 30 minutes after subsequent injections Angela Nevin 2015) or in accordance with individual institution policies and procedures.No recommendations for any changes at this time. ? ? ?Butch Penny, PharmD, BCACP, CPP ?Clinical Pharmacist ?[facility] ?[phone] ? ? ? ? ? ? ? ? ? ?

## 2022-02-12 ENCOUNTER — Other Ambulatory Visit (HOSPITAL_COMMUNITY): Payer: Self-pay

## 2022-03-05 ENCOUNTER — Other Ambulatory Visit (HOSPITAL_COMMUNITY): Payer: Self-pay

## 2022-03-05 MED ORDER — AMPHETAMINE-DEXTROAMPHETAMINE 30 MG PO TABS
ORAL_TABLET | Freq: Two times a day (BID) | ORAL | 0 refills | Status: AC
  Filled 2022-03-05: qty 60, 30d supply, fill #0

## 2022-03-12 DIAGNOSIS — F900 Attention-deficit hyperactivity disorder, predominantly inattentive type: Secondary | ICD-10-CM | POA: Diagnosis not present

## 2022-03-12 DIAGNOSIS — F411 Generalized anxiety disorder: Secondary | ICD-10-CM | POA: Diagnosis not present

## 2022-03-13 ENCOUNTER — Other Ambulatory Visit (HOSPITAL_COMMUNITY): Payer: Self-pay

## 2022-03-13 MED ORDER — ALPRAZOLAM 0.5 MG PO TABS
ORAL_TABLET | ORAL | 2 refills | Status: AC
  Filled 2022-03-13: qty 60, 30d supply, fill #0
  Filled 2022-04-04 (×2): qty 60, 30d supply, fill #1

## 2022-03-14 ENCOUNTER — Other Ambulatory Visit (HOSPITAL_COMMUNITY): Payer: Self-pay

## 2022-03-16 ENCOUNTER — Other Ambulatory Visit (HOSPITAL_COMMUNITY): Payer: Self-pay

## 2022-04-03 ENCOUNTER — Other Ambulatory Visit (HOSPITAL_COMMUNITY): Payer: Self-pay

## 2022-04-03 MED ORDER — AMPHETAMINE-DEXTROAMPHETAMINE 30 MG PO TABS
30.0000 mg | ORAL_TABLET | Freq: Two times a day (BID) | ORAL | 0 refills | Status: DC
  Filled 2022-04-04 – 2022-04-06 (×3): qty 60, 30d supply, fill #0

## 2022-04-04 ENCOUNTER — Other Ambulatory Visit (HOSPITAL_COMMUNITY): Payer: Self-pay

## 2022-04-06 ENCOUNTER — Other Ambulatory Visit (HOSPITAL_COMMUNITY): Payer: Self-pay

## 2022-04-10 ENCOUNTER — Other Ambulatory Visit (HOSPITAL_COMMUNITY): Payer: Self-pay

## 2022-04-11 ENCOUNTER — Other Ambulatory Visit (HOSPITAL_COMMUNITY): Payer: Self-pay

## 2022-04-13 ENCOUNTER — Other Ambulatory Visit (HOSPITAL_COMMUNITY): Payer: Self-pay

## 2022-04-14 ENCOUNTER — Other Ambulatory Visit (HOSPITAL_COMMUNITY): Payer: Self-pay

## 2022-05-04 ENCOUNTER — Other Ambulatory Visit (HOSPITAL_COMMUNITY): Payer: Self-pay

## 2022-05-04 DIAGNOSIS — H18513 Endothelial corneal dystrophy, bilateral: Secondary | ICD-10-CM | POA: Diagnosis not present

## 2022-05-04 DIAGNOSIS — H0102A Squamous blepharitis right eye, upper and lower eyelids: Secondary | ICD-10-CM | POA: Diagnosis not present

## 2022-05-04 DIAGNOSIS — H04123 Dry eye syndrome of bilateral lacrimal glands: Secondary | ICD-10-CM | POA: Diagnosis not present

## 2022-05-04 DIAGNOSIS — H0102B Squamous blepharitis left eye, upper and lower eyelids: Secondary | ICD-10-CM | POA: Diagnosis not present

## 2022-05-04 MED ORDER — CYCLOSPORINE 0.05 % OP EMUL
OPHTHALMIC | 5 refills | Status: DC
  Filled 2022-05-04: qty 5.5, 30d supply, fill #0
  Filled 2022-06-05: qty 5.5, 30d supply, fill #1
  Filled 2022-10-07: qty 5.5, 30d supply, fill #2

## 2022-05-05 ENCOUNTER — Other Ambulatory Visit (HOSPITAL_COMMUNITY): Payer: Self-pay

## 2022-05-05 MED ORDER — AMPHETAMINE-DEXTROAMPHETAMINE 30 MG PO TABS
30.0000 mg | ORAL_TABLET | Freq: Two times a day (BID) | ORAL | 0 refills | Status: DC
  Filled 2022-05-05: qty 60, 30d supply, fill #0

## 2022-05-06 ENCOUNTER — Other Ambulatory Visit (HOSPITAL_COMMUNITY): Payer: Self-pay

## 2022-05-13 ENCOUNTER — Telehealth: Payer: Self-pay

## 2022-05-13 ENCOUNTER — Other Ambulatory Visit (HOSPITAL_COMMUNITY): Payer: Self-pay

## 2022-05-13 DIAGNOSIS — Z1212 Encounter for screening for malignant neoplasm of rectum: Secondary | ICD-10-CM | POA: Diagnosis not present

## 2022-05-13 DIAGNOSIS — Z0000 Encounter for general adult medical examination without abnormal findings: Secondary | ICD-10-CM | POA: Diagnosis not present

## 2022-05-13 DIAGNOSIS — Z13220 Encounter for screening for lipoid disorders: Secondary | ICD-10-CM | POA: Diagnosis not present

## 2022-05-13 DIAGNOSIS — Z1211 Encounter for screening for malignant neoplasm of colon: Secondary | ICD-10-CM | POA: Diagnosis not present

## 2022-05-13 DIAGNOSIS — Z1231 Encounter for screening mammogram for malignant neoplasm of breast: Secondary | ICD-10-CM | POA: Diagnosis not present

## 2022-05-13 DIAGNOSIS — Z1329 Encounter for screening for other suspected endocrine disorder: Secondary | ICD-10-CM | POA: Diagnosis not present

## 2022-05-13 DIAGNOSIS — Z23 Encounter for immunization: Secondary | ICD-10-CM | POA: Diagnosis not present

## 2022-05-13 NOTE — Telephone Encounter (Signed)
Per [facility] pt's current PA is expiring.  Submitted an URGENT Prior Authorization request to [facility] for XOLAIR via CoverMyMeds. Will update once we receive a response.   Key: [id number]

## 2022-05-14 ENCOUNTER — Other Ambulatory Visit (HOSPITAL_COMMUNITY): Payer: Self-pay

## 2022-05-14 NOTE — Telephone Encounter (Signed)
Received notification from [facility] regarding a prior authorization for XOLAIR. Authorization has been APPROVED from 05/13/2022 to 05/13/2023. Approval letter sent to scan center.  Authorization # [id number]

## 2022-05-26 DIAGNOSIS — Z1212 Encounter for screening for malignant neoplasm of rectum: Secondary | ICD-10-CM | POA: Diagnosis not present

## 2022-05-26 DIAGNOSIS — Z1211 Encounter for screening for malignant neoplasm of colon: Secondary | ICD-10-CM | POA: Diagnosis not present

## 2022-06-03 ENCOUNTER — Other Ambulatory Visit (HOSPITAL_COMMUNITY): Payer: Self-pay

## 2022-06-05 ENCOUNTER — Other Ambulatory Visit (HOSPITAL_COMMUNITY): Payer: Self-pay

## 2022-06-05 ENCOUNTER — Other Ambulatory Visit: Payer: Self-pay | Admitting: Acute Care

## 2022-06-05 MED ORDER — MONTELUKAST SODIUM 10 MG PO TABS
10.0000 mg | ORAL_TABLET | Freq: Every day | ORAL | 3 refills | Status: DC
Start: 2022-06-05 — End: 2022-09-11
  Filled 2022-06-05: qty 90, 90d supply, fill #0

## 2022-06-05 MED ORDER — AMPHETAMINE-DEXTROAMPHETAMINE 30 MG PO TABS
1.0000 | ORAL_TABLET | Freq: Two times a day (BID) | ORAL | 0 refills | Status: DC
  Filled 2022-06-05: qty 60, 30d supply, fill #0

## 2022-06-08 LAB — COLOGUARD: COLOGUARD: NEGATIVE

## 2022-06-09 ENCOUNTER — Other Ambulatory Visit (HOSPITAL_COMMUNITY): Payer: Self-pay

## 2022-06-11 ENCOUNTER — Other Ambulatory Visit (HOSPITAL_COMMUNITY): Payer: Self-pay

## 2022-06-29 ENCOUNTER — Other Ambulatory Visit (HOSPITAL_COMMUNITY): Payer: Self-pay

## 2022-07-01 ENCOUNTER — Other Ambulatory Visit (HOSPITAL_COMMUNITY): Payer: Self-pay

## 2022-07-02 ENCOUNTER — Other Ambulatory Visit (HOSPITAL_COMMUNITY): Payer: Self-pay

## 2022-07-06 ENCOUNTER — Other Ambulatory Visit (HOSPITAL_COMMUNITY): Payer: Self-pay

## 2022-07-06 MED ORDER — AMPHETAMINE-DEXTROAMPHETAMINE 30 MG PO TABS
30.0000 mg | ORAL_TABLET | Freq: Two times a day (BID) | ORAL | 0 refills | Status: DC
  Filled 2022-07-06: qty 60, 30d supply, fill #0

## 2022-07-07 ENCOUNTER — Other Ambulatory Visit (HOSPITAL_COMMUNITY): Payer: Self-pay

## 2022-07-08 ENCOUNTER — Other Ambulatory Visit (HOSPITAL_COMMUNITY): Payer: Self-pay

## 2022-07-31 ENCOUNTER — Other Ambulatory Visit (HOSPITAL_COMMUNITY): Payer: Self-pay

## 2022-08-03 ENCOUNTER — Other Ambulatory Visit (HOSPITAL_COMMUNITY): Payer: Self-pay

## 2022-08-04 ENCOUNTER — Other Ambulatory Visit (HOSPITAL_COMMUNITY): Payer: Self-pay

## 2022-08-04 MED ORDER — AMPHETAMINE-DEXTROAMPHETAMINE 30 MG PO TABS
30.0000 mg | ORAL_TABLET | Freq: Two times a day (BID) | ORAL | 0 refills | Status: DC
  Filled 2022-08-08: qty 60, 30d supply, fill #0

## 2022-08-05 ENCOUNTER — Other Ambulatory Visit (HOSPITAL_COMMUNITY): Payer: Self-pay

## 2022-08-05 ENCOUNTER — Other Ambulatory Visit: Payer: Self-pay | Admitting: Acute Care

## 2022-08-05 DIAGNOSIS — J4551 Severe persistent asthma with (acute) exacerbation: Secondary | ICD-10-CM

## 2022-08-06 ENCOUNTER — Other Ambulatory Visit: Payer: Self-pay | Admitting: Pharmacist

## 2022-08-06 ENCOUNTER — Other Ambulatory Visit (HOSPITAL_COMMUNITY): Payer: Self-pay

## 2022-08-06 ENCOUNTER — Telehealth: Payer: Self-pay | Admitting: Acute Care

## 2022-08-06 DIAGNOSIS — J4551 Severe persistent asthma with (acute) exacerbation: Secondary | ICD-10-CM

## 2022-08-06 MED ORDER — OMALIZUMAB 150 MG/ML ~~LOC~~ SOSY
300.0000 mg | PREFILLED_SYRINGE | SUBCUTANEOUS | 0 refills | Status: DC
  Filled 2022-08-06: qty 2, 28d supply, fill #0

## 2022-08-06 MED ORDER — OMALIZUMAB 150 MG/ML ~~LOC~~ SOSY
300.0000 mg | PREFILLED_SYRINGE | SUBCUTANEOUS | 0 refills | Status: DC
  Filled 2022-08-06 (×2): qty 2, 28d supply, fill #0

## 2022-08-07 ENCOUNTER — Other Ambulatory Visit (HOSPITAL_COMMUNITY): Payer: Self-pay

## 2022-08-07 MED ORDER — SYMBICORT 160-4.5 MCG/ACT IN AERO
2.0000 | INHALATION_SPRAY | Freq: Two times a day (BID) | RESPIRATORY_TRACT | 2 refills | Status: DC
  Filled 2022-08-07: qty 10.2, 30d supply, fill #0
  Filled 2022-10-07 – 2023-03-17 (×2): qty 10.2, 30d supply, fill #1
  Filled 2023-06-07: qty 10.2, 30d supply, fill #2

## 2022-08-07 NOTE — Telephone Encounter (Signed)
Patient is scheduled on 12/8 (pt requested to see Eric Form, NP)

## 2022-08-08 ENCOUNTER — Other Ambulatory Visit (HOSPITAL_COMMUNITY): Payer: Self-pay

## 2022-08-17 ENCOUNTER — Other Ambulatory Visit (HOSPITAL_COMMUNITY): Payer: Self-pay

## 2022-08-17 DIAGNOSIS — H0102A Squamous blepharitis right eye, upper and lower eyelids: Secondary | ICD-10-CM | POA: Diagnosis not present

## 2022-08-17 DIAGNOSIS — H0102B Squamous blepharitis left eye, upper and lower eyelids: Secondary | ICD-10-CM | POA: Diagnosis not present

## 2022-08-17 DIAGNOSIS — H18513 Endothelial corneal dystrophy, bilateral: Secondary | ICD-10-CM | POA: Diagnosis not present

## 2022-08-17 DIAGNOSIS — H0288A Meibomian gland dysfunction right eye, upper and lower eyelids: Secondary | ICD-10-CM | POA: Diagnosis not present

## 2022-08-17 DIAGNOSIS — H04123 Dry eye syndrome of bilateral lacrimal glands: Secondary | ICD-10-CM | POA: Diagnosis not present

## 2022-08-17 DIAGNOSIS — H0288B Meibomian gland dysfunction left eye, upper and lower eyelids: Secondary | ICD-10-CM | POA: Diagnosis not present

## 2022-08-17 DIAGNOSIS — H524 Presbyopia: Secondary | ICD-10-CM | POA: Diagnosis not present

## 2022-08-17 MED ORDER — CYCLOSPORINE 0.05 % OP EMUL
OPHTHALMIC | 5 refills | Status: DC
Start: 2022-08-17 — End: 2023-11-15
  Filled 2022-08-17: qty 5.5, 30d supply, fill #0
  Filled 2022-10-07: qty 11, 55d supply, fill #0

## 2022-08-18 ENCOUNTER — Other Ambulatory Visit (HOSPITAL_COMMUNITY): Payer: Self-pay

## 2022-08-31 ENCOUNTER — Other Ambulatory Visit (HOSPITAL_COMMUNITY): Payer: Self-pay

## 2022-09-02 ENCOUNTER — Other Ambulatory Visit: Payer: Self-pay | Admitting: Acute Care

## 2022-09-02 ENCOUNTER — Other Ambulatory Visit: Payer: Self-pay

## 2022-09-02 ENCOUNTER — Other Ambulatory Visit (HOSPITAL_COMMUNITY): Payer: Self-pay

## 2022-09-02 DIAGNOSIS — J4551 Severe persistent asthma with (acute) exacerbation: Secondary | ICD-10-CM

## 2022-09-03 ENCOUNTER — Other Ambulatory Visit (HOSPITAL_COMMUNITY): Payer: Self-pay

## 2022-09-07 DIAGNOSIS — F3342 Major depressive disorder, recurrent, in full remission: Secondary | ICD-10-CM | POA: Diagnosis not present

## 2022-09-07 DIAGNOSIS — F900 Attention-deficit hyperactivity disorder, predominantly inattentive type: Secondary | ICD-10-CM | POA: Diagnosis not present

## 2022-09-07 DIAGNOSIS — F4322 Adjustment disorder with anxiety: Secondary | ICD-10-CM | POA: Diagnosis not present

## 2022-09-08 ENCOUNTER — Other Ambulatory Visit (HOSPITAL_COMMUNITY): Payer: Self-pay

## 2022-09-08 MED ORDER — ALPRAZOLAM 0.5 MG PO TABS
0.5000 mg | ORAL_TABLET | Freq: Two times a day (BID) | ORAL | 2 refills | Status: AC | PRN
  Filled 2022-09-08: qty 60, 30d supply, fill #0
  Filled 2022-10-07: qty 60, 30d supply, fill #1

## 2022-09-08 MED ORDER — BELSOMRA 20 MG PO TABS
20.0000 mg | ORAL_TABLET | Freq: Every day | ORAL | 5 refills | Status: DC
  Filled 2022-09-08: qty 30, 30d supply, fill #0

## 2022-09-08 MED ORDER — ESCITALOPRAM OXALATE 20 MG PO TABS
20.0000 mg | ORAL_TABLET | Freq: Every day | ORAL | 4 refills | Status: DC
  Filled 2022-09-08: qty 90, 90d supply, fill #0

## 2022-09-08 MED ORDER — AMPHETAMINE-DEXTROAMPHETAMINE 30 MG PO TABS
30.0000 mg | ORAL_TABLET | Freq: Two times a day (BID) | ORAL | 0 refills | Status: DC
  Filled 2022-09-08: qty 60, 30d supply, fill #0

## 2022-09-11 ENCOUNTER — Encounter: Payer: Self-pay | Admitting: Acute Care

## 2022-09-11 ENCOUNTER — Ambulatory Visit: Payer: 59 | Admitting: Acute Care

## 2022-09-11 ENCOUNTER — Other Ambulatory Visit: Payer: Self-pay | Admitting: Pharmacist

## 2022-09-11 ENCOUNTER — Other Ambulatory Visit (HOSPITAL_COMMUNITY): Payer: Self-pay

## 2022-09-11 VITALS — BP 140/80 | HR 72 | Temp 98.1°F | Ht 63.0 in | Wt 138.4 lb

## 2022-09-11 DIAGNOSIS — J45909 Unspecified asthma, uncomplicated: Secondary | ICD-10-CM | POA: Diagnosis not present

## 2022-09-11 DIAGNOSIS — J4551 Severe persistent asthma with (acute) exacerbation: Secondary | ICD-10-CM

## 2022-09-11 MED ORDER — OMALIZUMAB 150 MG/ML ~~LOC~~ SOSY
300.0000 mg | PREFILLED_SYRINGE | SUBCUTANEOUS | 12 refills | Status: DC
  Filled 2022-09-11: qty 2, 28d supply, fill #0
  Filled 2022-10-07: qty 2, 28d supply, fill #1

## 2022-09-11 MED ORDER — MONTELUKAST SODIUM 10 MG PO TABS
10.0000 mg | ORAL_TABLET | Freq: Every day | ORAL | 4 refills | Status: DC
  Filled 2022-09-11: qty 90, 90d supply, fill #0
  Filled 2022-10-07: qty 90, 90d supply, fill #1
  Filled 2023-03-17: qty 90, 90d supply, fill #2
  Filled 2023-06-14: qty 90, 90d supply, fill #3

## 2022-09-11 MED ORDER — OMALIZUMAB 150 MG/ML ~~LOC~~ SOSY
300.0000 mg | PREFILLED_SYRINGE | SUBCUTANEOUS | 12 refills | Status: DC
  Filled 2022-09-11: qty 2, 28d supply, fill #0

## 2022-09-11 NOTE — Progress Notes (Unsigned)
History of Present Illness Alexis Duffy is a 51 y.o. female with ***   09/11/2022 Pt. Presents for follow up to renew her Xolair. She has had some stress with her father and frequent hospital admissions. Asthma has been ok. She has had afew nights where she needed to use her albuterol rescue x 2 this month. She has 2 grandchildren who have been positive for RSV. She has not had any symptoms. She has been doing well. 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   Test Results:      No data to display              No data to display          BNP No results found for: "BNP"  ProBNP No results found for: "PROBNP"  PFT No results found for: "FEV1PRE", "FEV1POST", "FVCPRE", "FVCPOST", "TLC", "DLCOUNC", "PREFEV1FVCRT", "PSTFEV1FVCRT"  No results found.   Past medical hx Past Medical History:  Diagnosis Date   ADHD (attention deficit hyperactivity disorder)    Allergic rhinitis    Asthma    Hypothyroid    Low testosterone      Social History   Tobacco Use   Smoking status: Never   Smokeless tobacco: Never  Substance Use Topics   Alcohol use: Yes    Comment: 1-2 drinks per month   Drug use: No    Ms.Beedy reports that she has never smoked. She has never used smokeless tobacco. She reports current alcohol use. She reports that she does not use drugs.  Tobacco Cessation: Counseling given: Not Answered   Past surgical hx, Family hx, Social hx all reviewed.  Current Outpatient Medications on File Prior to Visit  Medication Sig   albuterol (PROVENTIL) (2.5 MG/3ML) 0.083% nebulizer solution Take 3 mLs (2.5 mg total) by nebulization every 4 (four) hours as needed for wheezing or shortness of breath.   albuterol (VENTOLIN HFA) 108 (90 Base) MCG/ACT inhaler Inhale 2 puffs into the lungs every 4 (four) hours as needed for wheezing or shortness of breath.   ALPRAZolam (XANAX) 0.5 MG tablet Take one tablet by mouth twice daily if needed.    amphetamine-dextroamphetamine (ADDERALL) 30 MG tablet Take 1 tablet by mouth 2 (two) times daily.   budesonide-formoterol (SYMBICORT) 160-4.5 MCG/ACT inhaler Inhale 2 puffs into the lungs in the morning and at bedtime.   cycloSPORINE (RESTASIS MULTIDOSE) 0.05 % ophthalmic emulsion Place 1 drop into both eyes every 12 hours.   cycloSPORINE (RESTASIS MULTIDOSE) 0.05 % ophthalmic emulsion Place 1 drop into both eyes every 12 hours.   EPINEPHrine 0.3 mg/0.3 mL IJ SOAJ injection Inject 0.3 mg into the muscle as needed for anaphylaxis.   fluticasone (FLONASE) 50 MCG/ACT nasal spray Place 2 sprays into both nostrils daily.   metoprolol tartrate (LOPRESSOR) 25 MG tablet Take 25 mg by mouth 2 (two) times daily.   montelukast (SINGULAIR) 10 MG tablet Take 1 tablet (10 mg total) by mouth at bedtime. Appointment required for future refills   omalizumab [name]) 150 MG/ML prefilled syringe Inject 300 mg into the skin every 28 (twenty-eight) days. Needs appt for further refills   Suvorexant (BELSOMRA) 20 MG TABS Take 1 tablet by mouth at bedtime.   Suvorexant (BELSOMRA) 20 MG TABS Take 20 mg (1 tablet) by mouth at bedtime.   SYMBICORT 160-4.5 MCG/ACT inhaler Inhale 2 puffs into the lungs 2 (two) times daily.   ALPRAZolam (XANAX) 0.5 MG tablet Take 1 tablet by mouth 2 times  a day as needed   ALPRAZolam (XANAX) 0.5 MG tablet Take 1 tablet (0.5 mg total) by mouth 2 (two) times daily as needed.   amphetamine-dextroamphetamine (ADDERALL) 30 MG tablet Take 1 tablet by mouth 2 times a day   amphetamine-dextroamphetamine (ADDERALL) 30 MG tablet Take 1 tablet by mouth 2 (two) times daily. (Patient not taking: Reported on 09/11/2022)   amphetamine-dextroamphetamine (ADDERALL) 30 MG tablet Take 1 tablet by mouth 2 (two) times daily.   escitalopram (LEXAPRO) 20 MG tablet Take 1 tablet (20 mg total) by mouth daily. (Patient not taking: Reported on 09/11/2022)   famotidine (PEPCID) 20 MG tablet Take 1 tablet (20 mg total) by  mouth 2 (two) times daily. (Patient not taking: Reported on 09/11/2022)   Current Facility-Administered Medications on File Prior to Visit  Medication   methylPREDNISolone acetate (DEPO-MEDROL) injection 40 mg     Allergies  Allergen Reactions   Asa [Aspirin]     Increased SOB    Review Of Systems:  Constitutional:   No  weight loss, night sweats,  Fevers, chills, fatigue, or  lassitude.  HEENT:   No headaches,  Difficulty swallowing,  Tooth/dental problems, or  Sore throat,                No sneezing, itching, ear ache, nasal congestion, post nasal drip,   CV:  No chest pain,  Orthopnea, PND, swelling in lower extremities, anasarca, dizziness, palpitations, syncope.   GI  No heartburn, indigestion, abdominal pain, nausea, vomiting, diarrhea, change in bowel habits, loss of appetite, bloody stools.   Resp: No shortness of breath with exertion or at rest.  No excess mucus, no productive cough,  No non-productive cough,  No coughing up of blood.  No change in color of mucus.  No wheezing.  No chest wall deformity  Skin: no rash or lesions.  GU: no dysuria, change in color of urine, no urgency or frequency.  No flank pain, no hematuria   MS:  No joint pain or swelling.  No decreased range of motion.  No back pain.  Psych:  No change in mood or affect. No depression or anxiety.  No memory loss.   Vital Signs BP (!) 140/80 (BP Location: Left Arm, Cuff Size: Normal)   Pulse 72   Temp 98.1 F (36.7 C) (Oral)   Ht 5\' 3"  (1.6 m)   Wt 138 lb 6.4 oz (62.8 kg)   SpO2 100%   BMI 24.52 kg/m    Physical Exam:  General- No distress,  A&Ox3 ENT: No sinus tenderness, TM clear, pale nasal mucosa, no oral exudate,no post nasal drip, no LAN Cardiac: S1, S2, regular rate and rhythm, no murmur Chest: No wheeze/ rales/ dullness; no accessory muscle use, no nasal flaring, no sternal retractions Abd.: Soft Non-tender Ext: No clubbing cyanosis, edema Neuro:  normal strength Skin: No  rashes, warm and dry Psych: normal mood and behavior   Assessment/Plan  No problem-specific Assessment & Plan notes found for this encounter.    , NP 09/11/2022  10:22 AM

## 2022-09-11 NOTE — Patient Instructions (Addendum)
It is good to see you today. I have refilled your Xolair and Singulair. Take as you have been doing. Continue Omeprazole with flares.  Continue Symbicort 2 puffs twice daily as you have been doing Rinse mouth after use. Use albuterol as needed for breakthrough shortness of breath. Follow up as needed for any flares.  Call as soon as you have symptoms Follow up in 12 months or sooner for symptoms of flare.  Please contact office for sooner follow up if symptoms do not improve or worsen or seek emergency care

## 2022-09-14 ENCOUNTER — Other Ambulatory Visit (HOSPITAL_COMMUNITY): Payer: Self-pay

## 2022-09-14 ENCOUNTER — Encounter: Payer: Self-pay | Admitting: Acute Care

## 2022-10-01 ENCOUNTER — Other Ambulatory Visit (HOSPITAL_COMMUNITY): Payer: Self-pay

## 2022-10-06 ENCOUNTER — Other Ambulatory Visit (HOSPITAL_COMMUNITY): Payer: Self-pay

## 2022-10-07 ENCOUNTER — Other Ambulatory Visit (HOSPITAL_COMMUNITY): Payer: Self-pay

## 2022-10-07 ENCOUNTER — Other Ambulatory Visit: Payer: Self-pay

## 2022-10-07 MED ORDER — AMPHETAMINE-DEXTROAMPHETAMINE 30 MG PO TABS
30.0000 mg | ORAL_TABLET | Freq: Two times a day (BID) | ORAL | 0 refills | Status: DC
  Filled 2022-10-08: qty 60, 30d supply, fill #0

## 2022-10-08 ENCOUNTER — Other Ambulatory Visit: Payer: Self-pay

## 2022-10-08 ENCOUNTER — Other Ambulatory Visit (HOSPITAL_COMMUNITY): Payer: Self-pay

## 2022-10-08 NOTE — Telephone Encounter (Signed)
Mychart message sent by pt: [facility] (supporting Magdalen Spatz, NP)12 minutes ago (9:23 AM)    Good morning  I have switched insurance to private pay [employer] because [facility] increased their hours required for insurance coverage. With that said the new insurance has over 200$ copay on Symbicort and I was wondering if you could please switch it to something covered by the new one. I was previously on Dulera before having to switch for [facility].    Thank you  Audie Wieser  Aug 13, 1971    Routing to both prior auth team as well as Sarah for review to see which inhaler would be more affordable with current insurance.

## 2022-10-09 ENCOUNTER — Other Ambulatory Visit (HOSPITAL_COMMUNITY): Payer: Self-pay

## 2022-10-09 ENCOUNTER — Other Ambulatory Visit: Payer: Self-pay

## 2022-10-12 ENCOUNTER — Other Ambulatory Visit: Payer: Self-pay

## 2022-10-12 ENCOUNTER — Other Ambulatory Visit (HOSPITAL_COMMUNITY): Payer: Self-pay

## 2022-10-13 ENCOUNTER — Other Ambulatory Visit (HOSPITAL_COMMUNITY): Payer: Self-pay

## 2022-10-14 ENCOUNTER — Telehealth: Payer: Self-pay

## 2022-10-14 ENCOUNTER — Other Ambulatory Visit (HOSPITAL_COMMUNITY): Payer: Self-pay

## 2022-10-14 ENCOUNTER — Other Ambulatory Visit: Payer: Self-pay

## 2022-10-14 ENCOUNTER — Other Ambulatory Visit: Payer: Self-pay | Admitting: Pharmacist

## 2022-10-14 DIAGNOSIS — J4551 Severe persistent asthma with (acute) exacerbation: Secondary | ICD-10-CM

## 2022-10-14 MED ORDER — OMALIZUMAB 150 MG/ML ~~LOC~~ SOSY
300.0000 mg | PREFILLED_SYRINGE | SUBCUTANEOUS | 5 refills | Status: DC
  Filled 2022-10-14 – 2022-10-19 (×2): qty 2, 28d supply, fill #0
  Filled 2022-11-06: qty 2, 28d supply, fill #1
  Filled 2022-12-07: qty 2, 28d supply, fill #2
  Filled 2022-12-31: qty 2, 28d supply, fill #3
  Filled 2023-02-02: qty 2, 28d supply, fill #4
  Filled 2023-03-02: qty 2, 28d supply, fill #5

## 2022-10-14 NOTE — Telephone Encounter (Signed)
Received notification from [facility] that pt has switched insurances and will require a new PA.  Submitted an URGENT Prior Authorization request to [employer] for [employer] via CoverMyMeds. Will update once we receive a response.  Key: [id number]

## 2022-10-14 NOTE — Telephone Encounter (Signed)
Rx for Xolair sent to [facility] for 6 months only per office protocol. Dose: 300mg  SQ every 4 weeks  Last OV 09/11/2022  Knox Saliva, PharmD, MPH, BCPS, CPP Clinical Pharmacist (Rheumatology and Pulmonology)

## 2022-10-15 NOTE — Telephone Encounter (Signed)
Received PA from from [facility] requesting clarification on if Xolair will be self-admin or office-administered. Completed forms and faxed back to [facility] with clinicals  Fax: [phone] Phone: [phone] Case # [id number]   Knox Saliva, PharmD, MPH, BCPS, CPP Clinical Pharmacist (Rheumatology and Pulmonology)

## 2022-10-16 ENCOUNTER — Other Ambulatory Visit (HOSPITAL_COMMUNITY): Payer: Self-pay

## 2022-10-16 ENCOUNTER — Other Ambulatory Visit: Payer: Self-pay | Admitting: Acute Care

## 2022-10-16 DIAGNOSIS — J4551 Severe persistent asthma with (acute) exacerbation: Secondary | ICD-10-CM

## 2022-10-16 NOTE — Telephone Encounter (Addendum)
Received notification from [facility] regarding a prior authorization for XOLAIR. Authorization has been APPROVED from 10/14/2022 to 10/13/2023. Approval letter sent to scan center.  Authorization/Reference # [id number]   PA has not yet adjudicated at pharmacy level, therefore information regarding copay and pharmacy lockout are not available at this time.

## 2022-10-16 NOTE — Telephone Encounter (Signed)
Received notification from [facility] that prescription has been successfully billed through both insurance and copay card. Nothing further required at this time.

## 2022-10-16 NOTE — Telephone Encounter (Signed)
Received fax from [facility] stating that the INITIAL form would need to be completed and faxed in due to pt never having previously received an approval through [facility] for this medication. Completed attached form and faxed in along with printout of labs, OV notes from 08/14/2013 (prior to therapy), and most recent OV note (09/11/2022). Will await determination.  Fax# [phone] Phone# [phone] Case# [id number]

## 2022-10-19 ENCOUNTER — Other Ambulatory Visit (HOSPITAL_COMMUNITY): Payer: Self-pay

## 2022-11-04 ENCOUNTER — Other Ambulatory Visit (HOSPITAL_COMMUNITY): Payer: Self-pay

## 2022-11-06 ENCOUNTER — Other Ambulatory Visit (HOSPITAL_COMMUNITY): Payer: Self-pay

## 2022-11-06 MED ORDER — AMPHETAMINE-DEXTROAMPHETAMINE 30 MG PO TABS
30.0000 mg | ORAL_TABLET | Freq: Two times a day (BID) | ORAL | 0 refills | Status: DC
  Filled 2022-11-10: qty 60, 30d supply, fill #0

## 2022-11-09 ENCOUNTER — Other Ambulatory Visit: Payer: Self-pay

## 2022-11-10 ENCOUNTER — Other Ambulatory Visit: Payer: Self-pay

## 2022-11-10 ENCOUNTER — Other Ambulatory Visit (HOSPITAL_COMMUNITY): Payer: Self-pay

## 2022-11-18 DIAGNOSIS — H0102A Squamous blepharitis right eye, upper and lower eyelids: Secondary | ICD-10-CM | POA: Diagnosis not present

## 2022-11-18 DIAGNOSIS — H04123 Dry eye syndrome of bilateral lacrimal glands: Secondary | ICD-10-CM | POA: Diagnosis not present

## 2022-11-18 DIAGNOSIS — H18513 Endothelial corneal dystrophy, bilateral: Secondary | ICD-10-CM | POA: Diagnosis not present

## 2022-11-18 DIAGNOSIS — H0102B Squamous blepharitis left eye, upper and lower eyelids: Secondary | ICD-10-CM | POA: Diagnosis not present

## 2022-12-02 ENCOUNTER — Other Ambulatory Visit (HOSPITAL_COMMUNITY): Payer: Self-pay

## 2022-12-02 DIAGNOSIS — N951 Menopausal and female climacteric states: Secondary | ICD-10-CM | POA: Diagnosis not present

## 2022-12-02 DIAGNOSIS — R5383 Other fatigue: Secondary | ICD-10-CM | POA: Diagnosis not present

## 2022-12-07 ENCOUNTER — Other Ambulatory Visit: Payer: Self-pay

## 2022-12-07 ENCOUNTER — Other Ambulatory Visit (HOSPITAL_COMMUNITY): Payer: Self-pay

## 2022-12-07 DIAGNOSIS — R232 Flushing: Secondary | ICD-10-CM | POA: Diagnosis not present

## 2022-12-07 DIAGNOSIS — N951 Menopausal and female climacteric states: Secondary | ICD-10-CM | POA: Diagnosis not present

## 2022-12-07 DIAGNOSIS — R6882 Decreased libido: Secondary | ICD-10-CM | POA: Diagnosis not present

## 2022-12-07 DIAGNOSIS — Z79890 Hormone replacement therapy (postmenopausal): Secondary | ICD-10-CM | POA: Diagnosis not present

## 2022-12-10 ENCOUNTER — Other Ambulatory Visit (HOSPITAL_COMMUNITY): Payer: Self-pay

## 2022-12-10 MED ORDER — AMPHETAMINE-DEXTROAMPHETAMINE 30 MG PO TABS
30.0000 mg | ORAL_TABLET | Freq: Two times a day (BID) | ORAL | 0 refills | Status: DC
  Filled 2022-12-11: qty 60, 30d supply, fill #0

## 2022-12-11 ENCOUNTER — Other Ambulatory Visit (HOSPITAL_COMMUNITY): Payer: Self-pay

## 2022-12-14 ENCOUNTER — Other Ambulatory Visit (HOSPITAL_COMMUNITY): Payer: Self-pay

## 2022-12-29 ENCOUNTER — Other Ambulatory Visit (HOSPITAL_COMMUNITY): Payer: Self-pay

## 2022-12-31 ENCOUNTER — Other Ambulatory Visit (HOSPITAL_COMMUNITY): Payer: Self-pay

## 2023-01-04 ENCOUNTER — Other Ambulatory Visit: Payer: Self-pay

## 2023-01-05 ENCOUNTER — Other Ambulatory Visit: Payer: Self-pay

## 2023-01-08 ENCOUNTER — Other Ambulatory Visit (HOSPITAL_COMMUNITY): Payer: Self-pay

## 2023-01-08 MED ORDER — AMPHETAMINE-DEXTROAMPHETAMINE 30 MG PO TABS
30.0000 mg | ORAL_TABLET | Freq: Two times a day (BID) | ORAL | 0 refills | Status: DC
  Filled 2023-01-11 (×2): qty 60, 30d supply, fill #0

## 2023-01-09 ENCOUNTER — Other Ambulatory Visit (HOSPITAL_COMMUNITY): Payer: Self-pay

## 2023-01-11 ENCOUNTER — Other Ambulatory Visit (HOSPITAL_COMMUNITY): Payer: Self-pay

## 2023-01-13 DIAGNOSIS — R5383 Other fatigue: Secondary | ICD-10-CM | POA: Diagnosis not present

## 2023-01-13 DIAGNOSIS — N951 Menopausal and female climacteric states: Secondary | ICD-10-CM | POA: Diagnosis not present

## 2023-01-20 DIAGNOSIS — N951 Menopausal and female climacteric states: Secondary | ICD-10-CM | POA: Diagnosis not present

## 2023-01-20 DIAGNOSIS — R232 Flushing: Secondary | ICD-10-CM | POA: Diagnosis not present

## 2023-01-20 DIAGNOSIS — R6882 Decreased libido: Secondary | ICD-10-CM | POA: Diagnosis not present

## 2023-01-20 DIAGNOSIS — Z6826 Body mass index (BMI) 26.0-26.9, adult: Secondary | ICD-10-CM | POA: Diagnosis not present

## 2023-01-29 ENCOUNTER — Other Ambulatory Visit (HOSPITAL_COMMUNITY): Payer: Self-pay

## 2023-02-01 ENCOUNTER — Other Ambulatory Visit (HOSPITAL_COMMUNITY): Payer: Self-pay

## 2023-02-02 ENCOUNTER — Other Ambulatory Visit (HOSPITAL_COMMUNITY): Payer: Self-pay

## 2023-02-04 ENCOUNTER — Other Ambulatory Visit: Payer: Self-pay

## 2023-02-04 ENCOUNTER — Other Ambulatory Visit (HOSPITAL_COMMUNITY): Payer: Self-pay

## 2023-02-08 ENCOUNTER — Other Ambulatory Visit (HOSPITAL_COMMUNITY): Payer: Self-pay

## 2023-02-08 MED ORDER — AMPHETAMINE-DEXTROAMPHETAMINE 30 MG PO TABS
30.0000 mg | ORAL_TABLET | Freq: Two times a day (BID) | ORAL | 0 refills | Status: DC
  Filled 2023-02-10: qty 60, 30d supply, fill #0

## 2023-02-09 ENCOUNTER — Other Ambulatory Visit (HOSPITAL_COMMUNITY): Payer: Self-pay

## 2023-02-10 ENCOUNTER — Telehealth: Payer: Self-pay | Admitting: Pharmacist

## 2023-02-10 ENCOUNTER — Other Ambulatory Visit: Payer: Self-pay

## 2023-02-10 ENCOUNTER — Other Ambulatory Visit (HOSPITAL_COMMUNITY): Payer: Self-pay

## 2023-02-10 NOTE — Telephone Encounter (Signed)
Called patient to schedule an appointment for the [employer] [facility] [employer] [facility]. I was unable to reach the patient so I left a HIPAA-compliant message requesting that the patient return my call.   Luke Van Ausdall, PharmD, BCACP, CPP Clinical Pharmacist [facility] [phone]  

## 2023-02-17 DIAGNOSIS — H04123 Dry eye syndrome of bilateral lacrimal glands: Secondary | ICD-10-CM | POA: Diagnosis not present

## 2023-02-17 DIAGNOSIS — H0102B Squamous blepharitis left eye, upper and lower eyelids: Secondary | ICD-10-CM | POA: Diagnosis not present

## 2023-02-17 DIAGNOSIS — H18513 Endothelial corneal dystrophy, bilateral: Secondary | ICD-10-CM | POA: Diagnosis not present

## 2023-02-17 DIAGNOSIS — H0102A Squamous blepharitis right eye, upper and lower eyelids: Secondary | ICD-10-CM | POA: Diagnosis not present

## 2023-02-24 ENCOUNTER — Other Ambulatory Visit (HOSPITAL_COMMUNITY): Payer: Self-pay

## 2023-03-02 ENCOUNTER — Other Ambulatory Visit (HOSPITAL_COMMUNITY): Payer: Self-pay

## 2023-03-03 ENCOUNTER — Other Ambulatory Visit (HOSPITAL_COMMUNITY): Payer: Self-pay

## 2023-03-10 ENCOUNTER — Other Ambulatory Visit (HOSPITAL_COMMUNITY): Payer: Self-pay

## 2023-03-10 MED ORDER — AMPHETAMINE-DEXTROAMPHETAMINE 30 MG PO TABS
30.0000 mg | ORAL_TABLET | Freq: Two times a day (BID) | ORAL | 0 refills | Status: DC
  Filled 2023-03-17: qty 36, 18d supply, fill #0

## 2023-03-11 ENCOUNTER — Other Ambulatory Visit (HOSPITAL_COMMUNITY): Payer: Self-pay

## 2023-03-15 DIAGNOSIS — R5383 Other fatigue: Secondary | ICD-10-CM | POA: Diagnosis not present

## 2023-03-15 DIAGNOSIS — N951 Menopausal and female climacteric states: Secondary | ICD-10-CM | POA: Diagnosis not present

## 2023-03-17 ENCOUNTER — Other Ambulatory Visit: Payer: Self-pay

## 2023-03-17 ENCOUNTER — Other Ambulatory Visit (HOSPITAL_COMMUNITY): Payer: Self-pay

## 2023-03-17 DIAGNOSIS — N951 Menopausal and female climacteric states: Secondary | ICD-10-CM | POA: Diagnosis not present

## 2023-03-17 DIAGNOSIS — Z6826 Body mass index (BMI) 26.0-26.9, adult: Secondary | ICD-10-CM | POA: Diagnosis not present

## 2023-03-17 DIAGNOSIS — R6882 Decreased libido: Secondary | ICD-10-CM | POA: Diagnosis not present

## 2023-03-17 DIAGNOSIS — R232 Flushing: Secondary | ICD-10-CM | POA: Diagnosis not present

## 2023-03-24 ENCOUNTER — Other Ambulatory Visit (HOSPITAL_COMMUNITY): Payer: Self-pay

## 2023-03-25 ENCOUNTER — Other Ambulatory Visit (HOSPITAL_COMMUNITY): Payer: Self-pay

## 2023-03-25 ENCOUNTER — Other Ambulatory Visit: Payer: Self-pay | Admitting: Acute Care

## 2023-03-25 DIAGNOSIS — J4551 Severe persistent asthma with (acute) exacerbation: Secondary | ICD-10-CM

## 2023-03-26 ENCOUNTER — Other Ambulatory Visit (HOSPITAL_COMMUNITY): Payer: Self-pay

## 2023-03-29 ENCOUNTER — Other Ambulatory Visit (HOSPITAL_COMMUNITY): Payer: Self-pay

## 2023-03-29 ENCOUNTER — Other Ambulatory Visit: Payer: Self-pay | Admitting: Acute Care

## 2023-03-29 DIAGNOSIS — J4551 Severe persistent asthma with (acute) exacerbation: Secondary | ICD-10-CM

## 2023-03-30 ENCOUNTER — Other Ambulatory Visit (HOSPITAL_COMMUNITY): Payer: Self-pay

## 2023-03-30 MED ORDER — BELSOMRA 20 MG PO TABS
20.0000 mg | ORAL_TABLET | Freq: Every day | ORAL | 5 refills | Status: DC
  Filled 2023-03-30 – 2023-05-10 (×2): qty 30, 30d supply, fill #0

## 2023-03-30 MED ORDER — ALPRAZOLAM 0.5 MG PO TABS
0.5000 mg | ORAL_TABLET | Freq: Two times a day (BID) | ORAL | 2 refills | Status: AC | PRN
  Filled 2023-03-30: qty 60, 30d supply, fill #0

## 2023-03-30 MED ORDER — AMPHETAMINE-DEXTROAMPHETAMINE 30 MG PO TABS
30.0000 mg | ORAL_TABLET | Freq: Two times a day (BID) | ORAL | 0 refills | Status: DC
  Filled 2023-04-05 (×2): qty 60, 30d supply, fill #0

## 2023-03-31 ENCOUNTER — Other Ambulatory Visit (HOSPITAL_COMMUNITY): Payer: Self-pay

## 2023-04-01 ENCOUNTER — Other Ambulatory Visit (HOSPITAL_COMMUNITY): Payer: Self-pay

## 2023-04-01 MED ORDER — OMALIZUMAB 150 MG/ML ~~LOC~~ SOSY
300.0000 mg | PREFILLED_SYRINGE | SUBCUTANEOUS | 5 refills | Status: DC
  Filled 2023-04-01: qty 2, 28d supply, fill #0
  Filled 2023-04-26: qty 2, 28d supply, fill #1
  Filled 2023-05-25: qty 2, 28d supply, fill #2
  Filled 2023-06-23: qty 2, 28d supply, fill #3
  Filled 2023-07-26: qty 2, 28d supply, fill #4
  Filled 2023-08-23: qty 2, 28d supply, fill #5

## 2023-04-01 NOTE — Telephone Encounter (Signed)
Refill sent for [name] to [facility]: [phone]   Dose: 300 mg SQ every 4 weeks  Last OV: 09/11/2022 Provider: Kandice Robinsons, NP  Next OV: not scheduled (due in Dec 2024)  Chesley Mires, PharmD, MPH, BCPS Clinical Pharmacist (Rheumatology and Pulmonology)

## 2023-04-05 ENCOUNTER — Other Ambulatory Visit: Payer: Self-pay

## 2023-04-05 ENCOUNTER — Other Ambulatory Visit (HOSPITAL_COMMUNITY): Payer: Self-pay

## 2023-04-23 ENCOUNTER — Other Ambulatory Visit (HOSPITAL_COMMUNITY): Payer: Self-pay

## 2023-04-26 ENCOUNTER — Other Ambulatory Visit (HOSPITAL_COMMUNITY): Payer: Self-pay

## 2023-04-27 ENCOUNTER — Other Ambulatory Visit (HOSPITAL_COMMUNITY): Payer: Self-pay

## 2023-05-04 ENCOUNTER — Other Ambulatory Visit (HOSPITAL_COMMUNITY): Payer: Self-pay

## 2023-05-04 MED ORDER — AMPHETAMINE-DEXTROAMPHETAMINE 30 MG PO TABS
30.0000 mg | ORAL_TABLET | Freq: Two times a day (BID) | ORAL | 0 refills | Status: DC
  Filled 2023-05-25: qty 60, 30d supply, fill #0

## 2023-05-05 ENCOUNTER — Other Ambulatory Visit (HOSPITAL_COMMUNITY): Payer: Self-pay

## 2023-05-05 MED ORDER — AMPHETAMINE-DEXTROAMPHETAMINE 30 MG PO TABS
30.0000 mg | ORAL_TABLET | Freq: Every day | ORAL | 0 refills | Status: DC
  Filled 2023-05-05: qty 30, 30d supply, fill #0

## 2023-05-06 ENCOUNTER — Other Ambulatory Visit: Payer: Self-pay

## 2023-05-06 ENCOUNTER — Other Ambulatory Visit (HOSPITAL_COMMUNITY): Payer: Self-pay

## 2023-05-10 ENCOUNTER — Other Ambulatory Visit: Payer: Self-pay

## 2023-05-10 ENCOUNTER — Other Ambulatory Visit (HOSPITAL_COMMUNITY): Payer: Self-pay

## 2023-05-20 ENCOUNTER — Other Ambulatory Visit (HOSPITAL_COMMUNITY): Payer: Self-pay

## 2023-05-20 MED ORDER — AMPHETAMINE-DEXTROAMPHETAMINE 30 MG PO TABS
30.0000 mg | ORAL_TABLET | Freq: Two times a day (BID) | ORAL | 0 refills | Status: DC
  Filled 2023-06-23: qty 60, 30d supply, fill #0

## 2023-05-24 ENCOUNTER — Other Ambulatory Visit: Payer: Self-pay

## 2023-05-25 ENCOUNTER — Other Ambulatory Visit: Payer: Self-pay

## 2023-05-25 ENCOUNTER — Other Ambulatory Visit (HOSPITAL_COMMUNITY): Payer: Self-pay

## 2023-05-26 ENCOUNTER — Other Ambulatory Visit (HOSPITAL_COMMUNITY): Payer: Self-pay

## 2023-05-28 ENCOUNTER — Other Ambulatory Visit (HOSPITAL_COMMUNITY): Payer: Self-pay

## 2023-06-07 ENCOUNTER — Other Ambulatory Visit (HOSPITAL_COMMUNITY): Payer: Self-pay

## 2023-06-14 ENCOUNTER — Other Ambulatory Visit: Payer: Self-pay

## 2023-06-17 ENCOUNTER — Other Ambulatory Visit (HOSPITAL_COMMUNITY): Payer: Self-pay

## 2023-06-21 ENCOUNTER — Other Ambulatory Visit (HOSPITAL_COMMUNITY): Payer: Self-pay

## 2023-06-23 ENCOUNTER — Other Ambulatory Visit (HOSPITAL_COMMUNITY): Payer: Self-pay

## 2023-06-23 ENCOUNTER — Other Ambulatory Visit: Payer: Self-pay

## 2023-06-24 ENCOUNTER — Other Ambulatory Visit: Payer: Self-pay

## 2023-06-28 DIAGNOSIS — J4551 Severe persistent asthma with (acute) exacerbation: Secondary | ICD-10-CM | POA: Diagnosis not present

## 2023-06-28 DIAGNOSIS — I159 Secondary hypertension, unspecified: Secondary | ICD-10-CM | POA: Diagnosis not present

## 2023-06-28 DIAGNOSIS — Z1331 Encounter for screening for depression: Secondary | ICD-10-CM | POA: Diagnosis not present

## 2023-06-28 DIAGNOSIS — Z23 Encounter for immunization: Secondary | ICD-10-CM | POA: Diagnosis not present

## 2023-07-22 ENCOUNTER — Other Ambulatory Visit (HOSPITAL_COMMUNITY): Payer: Self-pay

## 2023-07-22 MED ORDER — AMPHETAMINE-DEXTROAMPHETAMINE 30 MG PO TABS
30.0000 mg | ORAL_TABLET | Freq: Two times a day (BID) | ORAL | 0 refills | Status: DC
  Filled 2023-07-26: qty 60, 30d supply, fill #0

## 2023-07-26 ENCOUNTER — Other Ambulatory Visit (HOSPITAL_COMMUNITY): Payer: Self-pay

## 2023-07-26 ENCOUNTER — Other Ambulatory Visit: Payer: Self-pay

## 2023-07-26 NOTE — Progress Notes (Signed)
Specialty Pharmacy Refill Coordination Note  Alexis Duffy is a 52 y.o. female contacted today regarding refills of specialty medication(s) Omalizumab   Patient requested Delivery   Delivery date: 07/29/23   Verified address: [address], [name], [phone]   Medication will be filled on 07/28/23.

## 2023-08-18 ENCOUNTER — Other Ambulatory Visit: Payer: Self-pay

## 2023-08-20 ENCOUNTER — Other Ambulatory Visit (HOSPITAL_COMMUNITY): Payer: Self-pay

## 2023-08-20 MED ORDER — AMPHETAMINE-DEXTROAMPHETAMINE 30 MG PO TABS
30.0000 mg | ORAL_TABLET | Freq: Two times a day (BID) | ORAL | 0 refills | Status: DC
  Filled 2023-08-27: qty 60, 30d supply, fill #0

## 2023-08-23 ENCOUNTER — Other Ambulatory Visit (HOSPITAL_COMMUNITY): Payer: Self-pay

## 2023-08-23 ENCOUNTER — Other Ambulatory Visit: Payer: Self-pay

## 2023-08-23 DIAGNOSIS — H0102A Squamous blepharitis right eye, upper and lower eyelids: Secondary | ICD-10-CM | POA: Diagnosis not present

## 2023-08-23 DIAGNOSIS — H18513 Endothelial corneal dystrophy, bilateral: Secondary | ICD-10-CM | POA: Diagnosis not present

## 2023-08-23 DIAGNOSIS — H0102B Squamous blepharitis left eye, upper and lower eyelids: Secondary | ICD-10-CM | POA: Diagnosis not present

## 2023-08-23 DIAGNOSIS — H04123 Dry eye syndrome of bilateral lacrimal glands: Secondary | ICD-10-CM | POA: Diagnosis not present

## 2023-08-23 NOTE — Progress Notes (Signed)
Specialty Pharmacy Refill Coordination Note  Alexis Duffy is a 52 y.o. female contacted today regarding refills of specialty medication(s) Omalizumab   Patient requested Delivery   Delivery date: 08/27/23   Verified address: [address] [name] [facility] [place] [facility] [address]   Medication will be filled on 08/26/23 for 09/01/23 injection.

## 2023-08-24 ENCOUNTER — Other Ambulatory Visit (HOSPITAL_COMMUNITY): Payer: Self-pay

## 2023-08-27 ENCOUNTER — Other Ambulatory Visit (HOSPITAL_COMMUNITY): Payer: Self-pay

## 2023-09-21 ENCOUNTER — Other Ambulatory Visit: Payer: Self-pay

## 2023-09-22 ENCOUNTER — Other Ambulatory Visit: Payer: Self-pay

## 2023-09-22 ENCOUNTER — Other Ambulatory Visit (HOSPITAL_COMMUNITY): Payer: Self-pay

## 2023-09-22 MED ORDER — AMPHETAMINE-DEXTROAMPHETAMINE 30 MG PO TABS
30.0000 mg | ORAL_TABLET | Freq: Two times a day (BID) | ORAL | 0 refills | Status: DC
  Filled 2023-09-30 (×2): qty 60, 30d supply, fill #0

## 2023-09-22 MED ORDER — ESCITALOPRAM OXALATE 20 MG PO TABS
20.0000 mg | ORAL_TABLET | Freq: Every day | ORAL | 3 refills | Status: DC
  Filled 2023-09-22: qty 90, 90d supply, fill #0

## 2023-09-24 ENCOUNTER — Other Ambulatory Visit: Payer: Self-pay

## 2023-09-24 ENCOUNTER — Telehealth: Payer: Self-pay | Admitting: Acute Care

## 2023-09-24 DIAGNOSIS — J4551 Severe persistent asthma with (acute) exacerbation: Secondary | ICD-10-CM

## 2023-09-24 NOTE — Progress Notes (Signed)
Refill request pending

## 2023-09-24 NOTE — Progress Notes (Signed)
Specialty Pharmacy Refill Coordination Note  Alexis Duffy is a 52 y.o. female contacted today regarding refills of specialty medication(s) Omalizumab [name])   Patient requested (Patient-Rptd) Delivery   Delivery date: (Patient-Rptd) 09/30/23   Verified address: (Patient-Rptd) [address] [name] [address]   Medication will be filled on 12.26.24 and delivered 12.27.24.   Patient requested delivery date that pharmacy is unable to fulfill. Will send patient MyChart message with updated delivery date.

## 2023-09-27 ENCOUNTER — Other Ambulatory Visit: Payer: Self-pay

## 2023-09-27 MED ORDER — OMALIZUMAB 150 MG/ML ~~LOC~~ SOSY
300.0000 mg | PREFILLED_SYRINGE | SUBCUTANEOUS | 1 refills | Status: DC
  Filled 2023-09-27: qty 2, 28d supply, fill #0
  Filled 2023-10-22: qty 2, 28d supply, fill #1

## 2023-09-27 NOTE — Progress Notes (Deleted)
09/27/23 Alexis Duffy  Refill denied by provide. Patient must have appointment first. Called patient and she confirmed that she scheduled next appointment for 11/15/23.

## 2023-09-27 NOTE — Telephone Encounter (Signed)
Appt on 11/15/2023.  Refills fr Xolair sent to get to this appointment to ensure no asthma excaerbations. MyChart message sent to pt - must keep this appt  Chesley Mires, PharmD, MPH, BCPS, CPP Clinical Pharmacist (Rheumatology and Pulmonology)

## 2023-09-27 NOTE — Progress Notes (Signed)
Encounter created by mistake

## 2023-09-27 NOTE — Telephone Encounter (Signed)
Refill request for Alexis Duffy DENIED. Patient needs OV before any further refills can be sent for a biologic per office protocol  Last OV 09/11/2022

## 2023-09-30 ENCOUNTER — Other Ambulatory Visit: Payer: Self-pay

## 2023-09-30 ENCOUNTER — Other Ambulatory Visit (HOSPITAL_COMMUNITY): Payer: Self-pay

## 2023-10-14 ENCOUNTER — Telehealth: Payer: Self-pay | Admitting: *Deleted

## 2023-10-14 DIAGNOSIS — J45909 Unspecified asthma, uncomplicated: Secondary | ICD-10-CM

## 2023-10-14 NOTE — Telephone Encounter (Signed)
 PT states her Symbicort & Singulair RX is needing a refill. She has gone w/o it for a mo.because [facility] keeps sending it to her PCP.  States she has a FU appt sched so please do a courtesy refill.   [facility] in [place].   [phone] is her #

## 2023-10-16 MED ORDER — BUDESONIDE-FORMOTEROL FUMARATE 160-4.5 MCG/ACT IN AERO: 2.0000 | INHALATION_SPRAY | Freq: Two times a day (BID) | RESPIRATORY_TRACT | 6 refills | Status: DC

## 2023-10-16 MED ORDER — MONTELUKAST SODIUM 10 MG PO TABS: 10.0000 mg | ORAL_TABLET | Freq: Every day | ORAL | 4 refills | Status: DC

## 2023-10-16 NOTE — Telephone Encounter (Signed)
 Meds have been refilled for pt.

## 2023-10-20 ENCOUNTER — Other Ambulatory Visit: Payer: Self-pay

## 2023-10-22 ENCOUNTER — Other Ambulatory Visit: Payer: Self-pay

## 2023-10-22 NOTE — Progress Notes (Signed)
Specialty Pharmacy Refill Coordination Note  Amarie Gaiser is a 53 y.o. female contacted today regarding refills of specialty medication(s) Omalizumab [name])   Patient requested (Patient-Rptd) Delivery   Delivery date: (Patient-Rptd) 10/29/23   Verified address: (Patient-Rptd) [address] [name] [address]   Medication will be filled on 10/28/23.

## 2023-10-27 ENCOUNTER — Other Ambulatory Visit: Payer: Self-pay

## 2023-10-28 ENCOUNTER — Other Ambulatory Visit (HOSPITAL_COMMUNITY): Payer: Self-pay

## 2023-10-28 ENCOUNTER — Other Ambulatory Visit: Payer: Self-pay

## 2023-10-28 MED ORDER — AMPHETAMINE-DEXTROAMPHETAMINE 30 MG PO TABS
30.0000 mg | ORAL_TABLET | Freq: Two times a day (BID) | ORAL | 0 refills | Status: AC
  Filled 2023-11-30: qty 60, 30d supply, fill #0

## 2023-10-28 MED ORDER — AMPHETAMINE-DEXTROAMPHETAMINE 30 MG PO TABS
30.0000 mg | ORAL_TABLET | Freq: Two times a day (BID) | ORAL | 0 refills | Status: AC
  Filled 2023-10-30: qty 60, 30d supply, fill #0

## 2023-10-28 MED ORDER — AMPHETAMINE-DEXTROAMPHETAMINE 30 MG PO TABS
30.0000 mg | ORAL_TABLET | Freq: Two times a day (BID) | ORAL | 0 refills | Status: AC
  Filled 2023-12-30: qty 60, 30d supply, fill #0

## 2023-10-28 NOTE — Progress Notes (Addendum)
10/28/23 CMA: Xolair  Left voicemail for patient to call Specialty Pharmacy. PA is required and will not be approved until patient has an appointment (next appt 02/10). Patient has not been seen since 09/11/22.

## 2023-10-29 ENCOUNTER — Other Ambulatory Visit (HOSPITAL_COMMUNITY): Payer: Self-pay

## 2023-10-29 ENCOUNTER — Other Ambulatory Visit: Payer: Self-pay

## 2023-10-29 NOTE — Progress Notes (Signed)
Refill cancelled - patient aware PA renewal is pending 2.10 appointment. Call center will follow-up with patient after appointment but patient advised to call if she has any questions.

## 2023-10-30 ENCOUNTER — Other Ambulatory Visit (HOSPITAL_COMMUNITY): Payer: Self-pay

## 2023-11-15 ENCOUNTER — Other Ambulatory Visit: Payer: Self-pay

## 2023-11-15 ENCOUNTER — Ambulatory Visit (INDEPENDENT_AMBULATORY_CARE_PROVIDER_SITE_OTHER): Payer: Commercial Managed Care - PPO | Admitting: Adult Health

## 2023-11-15 ENCOUNTER — Other Ambulatory Visit (HOSPITAL_COMMUNITY): Payer: Self-pay

## 2023-11-15 ENCOUNTER — Encounter: Payer: Self-pay | Admitting: Adult Health

## 2023-11-15 ENCOUNTER — Telehealth: Payer: Self-pay | Admitting: *Deleted

## 2023-11-15 VITALS — BP 140/90 | HR 76 | Temp 97.7°F | Ht 63.0 in | Wt 131.3 lb

## 2023-11-15 DIAGNOSIS — J4550 Severe persistent asthma, uncomplicated: Secondary | ICD-10-CM

## 2023-11-15 DIAGNOSIS — J301 Allergic rhinitis due to pollen: Secondary | ICD-10-CM | POA: Diagnosis not present

## 2023-11-15 DIAGNOSIS — J4551 Severe persistent asthma with (acute) exacerbation: Secondary | ICD-10-CM

## 2023-11-15 DIAGNOSIS — J45909 Unspecified asthma, uncomplicated: Secondary | ICD-10-CM | POA: Insufficient documentation

## 2023-11-15 LAB — POCT EXHALED NITRIC OXIDE: FeNO level (ppb): 10

## 2023-11-15 MED ORDER — ALBUTEROL SULFATE HFA 108 (90 BASE) MCG/ACT IN AERS
1.0000 | INHALATION_SPRAY | Freq: Four times a day (QID) | RESPIRATORY_TRACT | 3 refills | Status: DC | PRN
  Filled 2023-11-15: qty 6.7, 30d supply, fill #0
  Filled 2023-11-15: qty 6.7, 25d supply, fill #0
  Filled 2024-03-01: qty 6.7, 25d supply, fill #1
  Filled 2024-06-15: qty 6.7, 25d supply, fill #2
  Filled 2024-08-24: qty 6.7, 25d supply, fill #3

## 2023-11-15 MED ORDER — OMALIZUMAB 150 MG/ML ~~LOC~~ SOSY
300.0000 mg | PREFILLED_SYRINGE | SUBCUTANEOUS | 5 refills | Status: DC
  Filled 2023-11-15 – 2023-11-25 (×2): qty 2, 28d supply, fill #0
  Filled 2023-12-20: qty 2, 28d supply, fill #1
  Filled 2024-01-19 (×2): qty 2, 28d supply, fill #2
  Filled 2024-02-15: qty 2, 28d supply, fill #3
  Filled 2024-03-14 (×2): qty 2, 28d supply, fill #4
  Filled 2024-04-05: qty 2, 28d supply, fill #5

## 2023-11-15 MED ORDER — ALBUTEROL SULFATE (2.5 MG/3ML) 0.083% IN NEBU
2.5000 mg | INHALATION_SOLUTION | Freq: Four times a day (QID) | RESPIRATORY_TRACT | 3 refills | Status: DC | PRN
  Filled 2023-11-15: qty 150, 13d supply, fill #0
  Filled 2023-11-15: qty 120, 10d supply, fill #0
  Filled 2024-08-24: qty 150, 13d supply, fill #1

## 2023-11-15 NOTE — Assessment & Plan Note (Signed)
 Currently well-controlled.  Continue on Singulair  daily.  Claritin as needed  Plan  Patient Instructions  Continue on Symbicort  2 puffs twice daily, rinse after use Continue on Singulair  10 mg daily Albuterol  inhaler as needed Continue on Xolair  injections. May use Claritin or Zyrtec daily as needed Follow-up with Dr. Villa Greaser in 6 months with PFTs and as needed-[facility] office.

## 2023-11-15 NOTE — Progress Notes (Signed)
 @Patient  ID: Alexis Duffy, female    DOB: 03-28-1971, 53 y.o.   MRN: [id number]  Chief Complaint  Patient presents with   Follow-up    Referring provider: Nash Bade, MD  HPI: 53 year old female never smoker followed for severe persistent asthma with allergic phenotype  TEST/EVENTS :  Allergy  panel November 2014 multiple triggers including dust, dog dander, cat dander, grass, tree, ragweed, elevated IgE at 473  11/15/2023 Follow up ; Allergic Asthma , Allergic Rhinitis  Patient returns for a follow-up visit.  She was last seen December 2023.  She has severe persistent asthma with allergic phenotype.  She remains on Symbicort  twice daily, Singulair  daily and Xolair  monthly.   She says overall her breathing is doing She does need refills of her medication.  Also has ran out of her Xolair  and needs a refill.  Patient is a never smoker.  Chest x-ray in March 2023 was essentially clear with some central airway thickening. Has been doing well . Remains active , walking . 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  Has noticed occasional wheeze at night time last 2 weeks since out of Xolair . No fever or discolored mucus. No recent albuterol  use.  Feno today 10ppb   Allergies  Allergen Reactions   Asa [Aspirin]     Increased SOB    Immunization History  Administered Date(s) Administered   Influenza Inj Mdck Quad Pf 07/21/2017   Influenza Split 05/05/2013   Influenza, Seasonal, Injecte, Preservative Fre 07/12/2014   Influenza,inj,Quad PF,6+ Mos 07/12/2014, 07/24/2018   Influenza,inj,quad, With Preservative 07/21/2017   Influenza-Unspecified 07/03/2015, 07/21/2017, 07/04/2019, 07/31/2020   MMR 05/01/2013   PFIZER(Purple Top)SARS-COV-2 Vaccination 09/25/2019, 10/16/2019, 08/20/2020   PNEUMOCOCCAL CONJUGATE-20 05/13/2022   PPD Test 05/01/2013, 06/01/2013   Pneumococcal Polysaccharide-23 05/05/2013, 06/01/2013   Td 07/05/2006   Tdap 01/24/2013   Varicella 01/24/2013   Zoster  Recombinant(Shingrix) 05/13/2022   Zoster, Live 05/01/2013    Past Medical History:  Diagnosis Date   ADHD (attention deficit hyperactivity disorder)    Allergic rhinitis    Asthma    Hypothyroid    Low testosterone      Tobacco History: Social History   Tobacco Use  Smoking Status Never  Smokeless Tobacco Never   Counseling given: Not Answered   Outpatient Medications Prior to Visit  Medication Sig Dispense Refill   ALPRAZolam  (XANAX ) 0.5 MG tablet Take one tablet by mouth twice daily if needed. 60 tablet 2   ALPRAZolam  (XANAX ) 0.5 MG tablet Take 1 tablet by mouth 2 times a day as needed 60 tablet 2   ALPRAZolam  (XANAX ) 0.5 MG tablet Take 1 tablet (0.5 mg total) by mouth 2 (two) times daily as needed. 60 tablet 2   ALPRAZolam  (XANAX ) 0.5 MG tablet Take 1 tablet (0.5 mg total) by mouth 2 (two) times daily as needed. 60 tablet 2   amphetamine -dextroamphetamine  (ADDERALL) 30 MG tablet Take 1 tablet by mouth 2 (two) times daily. 60 tablet 0   budesonide -formoterol  (SYMBICORT ) 160-4.5 MCG/ACT inhaler Inhale 2 puffs into the lungs in the morning and at bedtime. 10.2 g 6   EPINEPHrine  0.3 mg/0.3 mL IJ SOAJ injection Inject 0.3 mg into the muscle as needed for anaphylaxis. 1 each 2   famotidine  (PEPCID ) 20 MG tablet Take 1 tablet (20 mg total) by mouth 2 (two) times daily. 30 tablet 3   metoprolol tartrate (LOPRESSOR) 25 MG tablet Take 25 mg by mouth 2 (two) times daily.     montelukast  (SINGULAIR ) 10 MG tablet Take  1 tablet (10 mg total) by mouth at bedtime. 90 tablet 4   omalizumab  (XOLAIR ) 150 MG/ML prefilled syringe Inject 300 mg into the skin every 28 (twenty-eight) days. Needs appt for further refills 2 mL 1   albuterol  (PROVENTIL ) (2.5 MG/3ML) 0.083% nebulizer solution Take 3 mLs (2.5 mg total) by nebulization every 4 (four) hours as needed for wheezing or shortness of breath. 120 mL 3   albuterol  (VENTOLIN  HFA) 108 (90 Base) MCG/ACT inhaler Inhale 2 puffs into the lungs every 4  (four) hours as needed for wheezing or shortness of breath. 18 g 4   amphetamine -dextroamphetamine  (ADDERALL) 30 MG tablet Take 1 tablet by mouth 2 times a day 60 tablet 0   [START ON 12/29/2023] amphetamine -dextroamphetamine  (ADDERALL) 30 MG tablet Take 1 tablet by mouth 2 (two) times daily. (Patient not taking: Reported on 11/15/2023) 60 tablet 0   [START ON 11/29/2023] amphetamine -dextroamphetamine  (ADDERALL) 30 MG tablet Take 1 tablet by mouth 2 (two) times daily. (Patient not taking: Reported on 11/15/2023) 60 tablet 0   amphetamine -dextroamphetamine  (ADDERALL) 30 MG tablet Take 1 tablet by mouth 2 (two) times daily. (Patient not taking: Reported on 09/11/2022) 60 tablet 0   amphetamine -dextroamphetamine  (ADDERALL) 30 MG tablet Take 1 tablet by mouth 2 (two) times daily. 60 tablet 0   amphetamine -dextroamphetamine  (ADDERALL) 30 MG tablet Take 1 tablet by mouth 2 (two) times daily. 60 tablet 0   amphetamine -dextroamphetamine  (ADDERALL) 30 MG tablet Take 1 tablet by mouth 2 (two) times daily. 60 tablet 0   amphetamine -dextroamphetamine  (ADDERALL) 30 MG tablet Take 1 tablet by mouth 2 (two) times daily. 60 tablet 0   amphetamine -dextroamphetamine  (ADDERALL) 30 MG tablet Take 1 tablet by mouth 2 (two) times daily. 60 tablet 0   amphetamine -dextroamphetamine  (ADDERALL) 30 MG tablet Take 1 tablet by mouth 2 (two) times daily. 60 tablet 0   amphetamine -dextroamphetamine  (ADDERALL) 30 MG tablet Take 1 tablet by mouth 2 (two) times daily. 60 tablet 0   amphetamine -dextroamphetamine  (ADDERALL) 30 MG tablet Take 1 tablet by mouth 2 (two) times daily. 60 tablet 0   amphetamine -dextroamphetamine  (ADDERALL) 30 MG tablet Take 1 tablet by mouth 2 (two) times daily. 60 tablet 0   amphetamine -dextroamphetamine  (ADDERALL) 30 MG tablet Take 1 tablet by mouth daily. 30 tablet 0   amphetamine -dextroamphetamine  (ADDERALL) 30 MG tablet Take 1 tablet by mouth 2 (two) times daily. 60 tablet 0   amphetamine -dextroamphetamine   (ADDERALL) 30 MG tablet Take 1 tablet by mouth 2 (two) times daily. 60 tablet 0   amphetamine -dextroamphetamine  (ADDERALL) 30 MG tablet Take 1 tablet by mouth 2 (two) times daily. 60 tablet 0   amphetamine -dextroamphetamine  (ADDERALL) 30 MG tablet Take 1 tablet by mouth 2 (two) times daily. 60 tablet 0   cycloSPORINE  (RESTASIS  MULTIDOSE) 0.05 % ophthalmic emulsion Place 1 drop into both eyes every 12 hours. 10 mL 5   cycloSPORINE  (RESTASIS  MULTIDOSE) 0.05 % ophthalmic emulsion Place 1 drop into both eyes every 12 hours. 10 mL 5   escitalopram  (LEXAPRO ) 20 MG tablet Take 1 tablet (20 mg total) by mouth daily. (Patient not taking: Reported on 09/11/2022) 90 tablet 4   escitalopram  (LEXAPRO ) 20 MG tablet Take 1 tablet (20 mg total) by mouth daily. 90 tablet 3   fluticasone  (FLONASE ) 50 MCG/ACT nasal spray Place 2 sprays into both nostrils daily. 16 g 3   Suvorexant  (BELSOMRA ) 20 MG TABS Take 1 tablet by mouth at bedtime. 30 tablet 5   Suvorexant  (BELSOMRA ) 20 MG TABS Take 20 mg (1 tablet) by  mouth at bedtime. 30 tablet 5   Suvorexant  (BELSOMRA ) 20 MG TABS Take 1 tablet (20 mg total) by mouth at bedtime. 30 tablet 5   Facility-Administered Medications Prior to Visit  Medication Dose Route Frequency Provider Last Rate Last Admin   methylPREDNISolone  acetate (DEPO-MEDROL ) injection 40 mg  40 mg Intramuscular Once Groce, Sarah F, NP         Review of Systems:   Constitutional:   No  weight loss, night sweats,  Fevers, chills, fatigue, or  lassitude.  HEENT:   No headaches,  Difficulty swallowing,  Tooth/dental problems, or  Sore throat,                No sneezing, itching, ear ache, nasal congestion, post nasal drip,   CV:  No chest pain,  Orthopnea, PND, swelling in lower extremities, anasarca, dizziness, palpitations, syncope.   GI  No heartburn, indigestion, abdominal pain, nausea, vomiting, diarrhea, change in bowel habits, loss of appetite, bloody stools.   Resp: No chest wall  deformity  Skin: no rash or lesions.  GU: no dysuria, change in color of urine, no urgency or frequency.  No flank pain, no hematuria   MS:  No joint pain or swelling.  No decreased range of motion.  No back pain.    Physical Exam  BP (!) 140/90 (BP Location: Left Arm, Patient Position: Sitting, Cuff Size: Normal)   Pulse 76   Temp 97.7 F (36.5 C) (Oral)   Ht 5\' 3"  (1.6 m)   Wt 131 lb 4.8 oz (59.6 kg)   SpO2 100%   BMI 23.26 kg/m   GEN: A/Ox3; pleasant , NAD, well nourished    HEENT:  [place]/AT,  NOSE-clear, THROAT-clear, no lesions, no postnasal drip or exudate noted.   NECK:  Supple w/ fair ROM; no JVD; normal carotid impulses w/o bruits; no thyromegaly or nodules palpated; no lymphadenopathy.    RESP  Clear  P & A; w/o, wheezes/ rales/ or rhonchi. no accessory muscle use, no dullness to percussion  CARD:  RRR, no m/r/g, no peripheral edema, pulses intact, no cyanosis or clubbing.  GI:   Soft & nt; nml bowel sounds; no organomegaly or masses detected.   Musco: Warm bil, no deformities or joint swelling noted.   Neuro: alert, no focal deficits noted.    Skin: Warm, no lesions or rashes    Lab Results:  CBC No results found for: "WBC", "RBC", "HGB", "HCT", "PLT", "MCV", "MCH", "MCHC", "RDW", "LYMPHSABS", "MONOABS", "EOSABS", "BASOSABS"  BMET No results found for: "NA", "K", "CL", "CO2", "GLUCOSE", "BUN", "CREATININE", "CALCIUM", "GFRNONAA", "GFRAA"  BNP No results found for: "BNP"  ProBNP No results found for: "PROBNP"  Imaging: No results found.  Administration History     None           No data to display          Lab Results  Component Value Date   NITRICOXIDE 56 10/21/2017        Assessment & Plan:   Asthma Severe persistent asthma with allergic phenotype.  Patient has been well-controlled on maintenance regimen with Symbicort , Singulair  and Xolair .  Unfortunately has run out of Xolair  has noticed slight uptick in asthma symptoms  with intermittent nocturnal wheezing.  Advised to use Claritin daily as needed for postnasal drainage symptoms.  Restart Xolair  immediately.  Prescription has been sent to the pharmacy team Check PFTs on return visit Asthma action plan discussed in detail  Plan  Patient Instructions  Continue on Symbicort  2 puffs twice daily, rinse after use Continue on Singulair  10 mg daily Albuterol  inhaler as needed Continue on Xolair  injections. May use Claritin or Zyrtec daily as needed Follow-up with Dr. Villa Greaser in 6 months with PFTs and as needed-[facility] office.      Allergic rhinitis Currently well-controlled.  Continue on Singulair  daily.  Claritin as needed  Plan  Patient Instructions  Continue on Symbicort  2 puffs twice daily, rinse after use Continue on Singulair  10 mg daily Albuterol  inhaler as needed Continue on Xolair  injections. May use Claritin or Zyrtec daily as needed Follow-up with Dr. Villa Greaser in 6 months with PFTs and as needed-[facility] office.        Roena Clark, NP 11/15/2023

## 2023-11-15 NOTE — Assessment & Plan Note (Addendum)
 Severe persistent asthma with allergic phenotype.  Patient has been well-controlled on maintenance regimen with Symbicort , Singulair  and Xolair .  Unfortunately has run out of Xolair  has noticed slight uptick in asthma symptoms with intermittent nocturnal wheezing.  Advised to use Claritin daily as needed for postnasal drainage symptoms.  Restart Xolair  immediately.  Prescription has been sent to the pharmacy team Check PFTs on return visit Asthma action plan discussed in detail  Plan  Patient Instructions  Continue on Symbicort  2 puffs twice daily, rinse after use Continue on Singulair  10 mg daily Albuterol  inhaler as needed Continue on Xolair  injections. May use Claritin or Zyrtec daily as needed Follow-up with Dr. Villa Greaser in 6 months with PFTs and as needed-[facility] office.

## 2023-11-15 NOTE — Telephone Encounter (Signed)
 Refill sent to [facility] for Xolair . They'll reach out to schedule shipment

## 2023-11-15 NOTE — Patient Instructions (Addendum)
 Continue on Symbicort  2 puffs twice daily, rinse after use Continue on Singulair  10 mg daily Albuterol  inhaler as needed Continue on Xolair  injections. May use Claritin or Zyrtec daily as needed Follow-up with Dr. Villa Greaser in 6 months with PFTs and as needed-[facility] office.

## 2023-11-15 NOTE — Telephone Encounter (Signed)
 Patient was seen in the office, she has been out of her Xolair  for about a month and having wheezing.  Pharmacy, please refill her Xolair .  Thank you.

## 2023-11-17 ENCOUNTER — Other Ambulatory Visit: Payer: Self-pay

## 2023-11-18 ENCOUNTER — Telehealth: Payer: Self-pay

## 2023-11-18 DIAGNOSIS — J4551 Severe persistent asthma with (acute) exacerbation: Secondary | ICD-10-CM

## 2023-11-18 DIAGNOSIS — J4550 Severe persistent asthma, uncomplicated: Secondary | ICD-10-CM

## 2023-11-18 NOTE — Telephone Encounter (Signed)
Received notification from [facility] pharmacy that patient requires a new authorization for their medication.  Initiated an URGENT Prior Authorization request to [employer] for [employer] via CoverMyMeds. Will complete once questions populate.  Key: [id number]

## 2023-11-19 NOTE — Telephone Encounter (Signed)
Questions never populated, however the status in CMM has been changed to "N/A" and now displays this message:   The status appears to have been changed around 3pm on 11/18/23. We should hopefully receive some sort of f/u correspondence from [facility] between the 17th through the 19th.

## 2023-11-22 NOTE — Telephone Encounter (Addendum)
Received fax from [facility] that is clinical form for Xolair PA  Competed and faxed back to [facility]  Fax: [phone] Phone: [phone] Case # [id number]  Chesley Mires, PharmD, MPH, BCPS, CPP Clinical Pharmacist (Rheumatology and Pulmonology)

## 2023-11-23 ENCOUNTER — Other Ambulatory Visit: Payer: Self-pay

## 2023-11-23 NOTE — Telephone Encounter (Signed)
Received notification from [facility] regarding a prior authorization for XOLAIR. Authorization has been APPROVED from 11/22/2023 to 11/21/2024. Approval letter sent to scan center.  Authorization # [id number]  Chesley Mires, PharmD, MPH, BCPS, CPP Clinical Pharmacist (Rheumatology and Pulmonology)

## 2023-11-25 ENCOUNTER — Other Ambulatory Visit: Payer: Self-pay

## 2023-11-25 ENCOUNTER — Other Ambulatory Visit: Payer: Self-pay | Admitting: Pharmacy Technician

## 2023-11-25 ENCOUNTER — Other Ambulatory Visit (HOSPITAL_COMMUNITY): Payer: Self-pay

## 2023-11-25 NOTE — Progress Notes (Signed)
Specialty Pharmacy Refill Coordination Note  Alexis Duffy is a 53 y.o. female contacted today regarding refills of specialty medication(s) Omalizumab [name])   Patient requested Delivery   Delivery date: 11/26/23   Verified address: [address]  [place]   Medication will be filled on 11/25/23.

## 2023-11-25 NOTE — Progress Notes (Signed)
Specialty Pharmacy Ongoing Clinical Assessment Note  Alexis Duffy is a 53 y.o. female who is being followed by the specialty pharmacy service for RxSp Asthma/COPD   Patient's specialty medication(s) reviewed today: Omalizumab [name])   Missed doses in the last 4 weeks: 2 (patient was awaiting an appt to get a new rx, restarting therapy)   Patient/Caregiver did not have any additional questions or concerns.   Therapeutic benefit summary: Patient is achieving benefit   Adverse events/side effects summary: No adverse events/side effects   Patient's therapy is appropriate to: Continue    Goals Addressed             This Visit's Progress    Minimize recurrence of flares       Patient is  restarting therapy . Patient will maintain adherence.  Patient reports that therapy was working well for her in the past.   She was off due to needing an appointment and a new prescription.          Follow up:  6 months  Servando Snare Specialty Pharmacist

## 2023-11-30 ENCOUNTER — Other Ambulatory Visit (HOSPITAL_COMMUNITY): Payer: Self-pay

## 2023-11-30 ENCOUNTER — Other Ambulatory Visit: Payer: Self-pay

## 2023-12-14 ENCOUNTER — Other Ambulatory Visit: Payer: Self-pay

## 2023-12-16 ENCOUNTER — Other Ambulatory Visit: Payer: Self-pay

## 2023-12-20 ENCOUNTER — Other Ambulatory Visit: Payer: Self-pay

## 2023-12-20 ENCOUNTER — Other Ambulatory Visit (HOSPITAL_COMMUNITY): Payer: Self-pay

## 2023-12-20 NOTE — Progress Notes (Signed)
 Specialty Pharmacy Refill Coordination Note  Verneal Wiers is a 53 y.o. female contacted today regarding refills of specialty medication(s) Omalizumab [name])   Patient requested (Patient-Rptd) Delivery   Delivery date: (Patient-Rptd) 12/24/23   Verified address: (Patient-Rptd) [address] [name] [address]   Medication will be filled on 12/23/23.

## 2023-12-21 ENCOUNTER — Other Ambulatory Visit (HOSPITAL_COMMUNITY): Payer: Self-pay

## 2023-12-23 ENCOUNTER — Other Ambulatory Visit: Payer: Self-pay

## 2023-12-30 ENCOUNTER — Other Ambulatory Visit (HOSPITAL_COMMUNITY): Payer: Self-pay

## 2024-01-19 ENCOUNTER — Other Ambulatory Visit: Payer: Self-pay

## 2024-01-19 ENCOUNTER — Other Ambulatory Visit (HOSPITAL_COMMUNITY): Payer: Self-pay

## 2024-01-19 NOTE — Progress Notes (Signed)
 Specialty Pharmacy Refill Coordination Note  Alexis Duffy is a 53 y.o. female contacted today regarding refills of specialty medication(s) Xolair.  Patient requested (Patient-Rptd) Delivery   Delivery date: (Patient-Rptd) 01/22/24   Verified address: (Patient-Rptd) [address] [name] [address]   Medication will be filled on 01/20/24. New delivery date is 01/21/24. Patient has been notified.

## 2024-01-20 ENCOUNTER — Other Ambulatory Visit: Payer: Self-pay

## 2024-01-26 ENCOUNTER — Other Ambulatory Visit (HOSPITAL_COMMUNITY): Payer: Self-pay

## 2024-01-26 MED ORDER — AMPHETAMINE-DEXTROAMPHETAMINE 30 MG PO TABS
30.0000 mg | ORAL_TABLET | Freq: Two times a day (BID) | ORAL | 0 refills | Status: DC
  Filled 2024-02-01: qty 60, 30d supply, fill #0

## 2024-01-26 MED ORDER — AMPHETAMINE-DEXTROAMPHETAMINE 30 MG PO TABS
30.0000 mg | ORAL_TABLET | Freq: Two times a day (BID) | ORAL | 0 refills | Status: DC
  Filled 2024-03-01: qty 60, 30d supply, fill #0

## 2024-01-27 ENCOUNTER — Other Ambulatory Visit (HOSPITAL_COMMUNITY): Payer: Self-pay

## 2024-01-27 DIAGNOSIS — E0590 Thyrotoxicosis, unspecified without thyrotoxic crisis or storm: Secondary | ICD-10-CM | POA: Diagnosis not present

## 2024-01-27 DIAGNOSIS — N951 Menopausal and female climacteric states: Secondary | ICD-10-CM | POA: Diagnosis not present

## 2024-02-01 ENCOUNTER — Other Ambulatory Visit: Payer: Self-pay

## 2024-02-02 DIAGNOSIS — E0590 Thyrotoxicosis, unspecified without thyrotoxic crisis or storm: Secondary | ICD-10-CM | POA: Diagnosis not present

## 2024-02-02 DIAGNOSIS — G4700 Insomnia, unspecified: Secondary | ICD-10-CM | POA: Diagnosis not present

## 2024-02-02 DIAGNOSIS — R232 Flushing: Secondary | ICD-10-CM | POA: Diagnosis not present

## 2024-02-02 DIAGNOSIS — N951 Menopausal and female climacteric states: Secondary | ICD-10-CM | POA: Diagnosis not present

## 2024-02-09 ENCOUNTER — Other Ambulatory Visit (HOSPITAL_COMMUNITY): Payer: Self-pay

## 2024-02-14 ENCOUNTER — Other Ambulatory Visit: Payer: Self-pay

## 2024-02-15 ENCOUNTER — Other Ambulatory Visit: Payer: Self-pay

## 2024-02-15 NOTE — Progress Notes (Signed)
 Specialty Pharmacy Refill Coordination Note  Alexis Duffy is a 53 y.o. female contacted today regarding refills of specialty medication(s) Omalizumab  (XOLAIR )   Patient requested (Patient-Rptd) Delivery   Delivery date: (Patient-Rptd) 02/18/24   Verified address: (Patient-Rptd) [address] [name] [address]   Medication will be filled on 02/17/24.

## 2024-02-16 ENCOUNTER — Other Ambulatory Visit: Payer: Self-pay

## 2024-02-17 DIAGNOSIS — Z1231 Encounter for screening mammogram for malignant neoplasm of breast: Secondary | ICD-10-CM | POA: Diagnosis not present

## 2024-02-17 DIAGNOSIS — R92323 Mammographic fibroglandular density, bilateral breasts: Secondary | ICD-10-CM | POA: Diagnosis not present

## 2024-03-01 ENCOUNTER — Other Ambulatory Visit: Payer: Self-pay

## 2024-03-01 ENCOUNTER — Other Ambulatory Visit (HOSPITAL_COMMUNITY): Payer: Self-pay

## 2024-03-14 ENCOUNTER — Encounter (INDEPENDENT_AMBULATORY_CARE_PROVIDER_SITE_OTHER): Payer: Self-pay

## 2024-03-14 ENCOUNTER — Other Ambulatory Visit: Payer: Self-pay

## 2024-03-14 ENCOUNTER — Other Ambulatory Visit: Payer: Self-pay | Admitting: Pharmacy Technician

## 2024-03-14 NOTE — Progress Notes (Signed)
 Specialty Pharmacy Refill Coordination Note  Alexis Duffy is a 53 y.o. female contacted today regarding refills of specialty medication(s) Omalizumab  (XOLAIR )   Patient requested Delivery   Delivery date: 03/15/24   Verified address: [address] [name] [address]   Medication will be filled on 03/14/24.

## 2024-03-17 ENCOUNTER — Other Ambulatory Visit: Payer: Self-pay

## 2024-03-17 ENCOUNTER — Other Ambulatory Visit (HOSPITAL_COMMUNITY): Payer: Self-pay

## 2024-03-17 MED ORDER — ALPRAZOLAM 0.5 MG PO TABS
0.5000 mg | ORAL_TABLET | Freq: Two times a day (BID) | ORAL | 2 refills | Status: DC | PRN
  Filled 2024-03-17: qty 60, 30d supply, fill #0
  Filled 2024-07-26: qty 60, 30d supply, fill #1
  Filled 2024-08-24: qty 60, 30d supply, fill #2

## 2024-04-04 ENCOUNTER — Other Ambulatory Visit (HOSPITAL_COMMUNITY): Payer: Self-pay

## 2024-04-04 ENCOUNTER — Encounter (INDEPENDENT_AMBULATORY_CARE_PROVIDER_SITE_OTHER): Payer: Self-pay

## 2024-04-05 ENCOUNTER — Other Ambulatory Visit: Payer: Self-pay

## 2024-04-05 ENCOUNTER — Other Ambulatory Visit (HOSPITAL_COMMUNITY): Payer: Self-pay

## 2024-04-05 MED ORDER — AMPHETAMINE-DEXTROAMPHETAMINE 30 MG PO TABS
30.0000 mg | ORAL_TABLET | Freq: Two times a day (BID) | ORAL | 0 refills | Status: DC
  Filled 2024-04-05: qty 60, 30d supply, fill #0

## 2024-04-05 NOTE — Progress Notes (Signed)
 Specialty Pharmacy Refill Coordination Note  Alexis Duffy is a 53 y.o. female contacted today regarding refills of specialty medication(s) Omalizumab  (XOLAIR )   Patient requested (Patient-Rptd) Delivery   Delivery date: 04/11/24   Verified address: (Patient-Rptd) [address] [name] [address]   Medication will be filled on 04/10/24.   Refill too soon until 7/3 sent mychart msg to inform patient of intended delivery date. Injection not due until 7/13.

## 2024-05-02 ENCOUNTER — Other Ambulatory Visit: Payer: Self-pay

## 2024-05-04 ENCOUNTER — Other Ambulatory Visit: Payer: Self-pay | Admitting: Adult Health

## 2024-05-04 ENCOUNTER — Other Ambulatory Visit (HOSPITAL_COMMUNITY): Payer: Self-pay

## 2024-05-04 ENCOUNTER — Encounter (INDEPENDENT_AMBULATORY_CARE_PROVIDER_SITE_OTHER): Payer: Self-pay

## 2024-05-04 ENCOUNTER — Other Ambulatory Visit: Payer: Self-pay

## 2024-05-04 DIAGNOSIS — J4551 Severe persistent asthma with (acute) exacerbation: Secondary | ICD-10-CM

## 2024-05-04 MED ORDER — OMALIZUMAB 150 MG/ML ~~LOC~~ SOSY
300.0000 mg | PREFILLED_SYRINGE | SUBCUTANEOUS | 2 refills | Status: DC
  Filled 2024-05-04 – 2024-05-05 (×2): qty 2, 28d supply, fill #0
  Filled 2024-06-01: qty 2, 28d supply, fill #1
  Filled 2024-07-04: qty 2, 28d supply, fill #2

## 2024-05-04 NOTE — Telephone Encounter (Signed)
 Refill sent for XOLAIR  to [facility]: [phone]   Dose: 300mg  subcut every 28 days  Last OV: 11/15/2023 Provider: Madelin Stank, NP and Dr. Jude Next OV: due  Routing to scheduling team for follow-up on appt scheduling  Sherry Pennant, PharmD, MPH, BCPS Clinical Pharmacist (Rheumatology and Pulmonology)

## 2024-05-05 ENCOUNTER — Other Ambulatory Visit: Payer: Self-pay | Admitting: Adult Health

## 2024-05-05 ENCOUNTER — Other Ambulatory Visit: Payer: Self-pay

## 2024-05-05 DIAGNOSIS — J4551 Severe persistent asthma with (acute) exacerbation: Secondary | ICD-10-CM

## 2024-05-05 NOTE — Progress Notes (Signed)
 Specialty Pharmacy Refill Coordination Note  Alexis Duffy is a 53 y.o. female contacted today regarding refills of specialty medication(s) Omalizumab  (XOLAIR )   Patient requested (Patient-Rptd) Delivery   Delivery date: 05/11/24   Verified address: (Patient-Rptd) [address] [place] [address]   Medication will be filled on 05/10/24, pending refill approval.

## 2024-05-12 ENCOUNTER — Other Ambulatory Visit: Payer: Self-pay

## 2024-05-12 ENCOUNTER — Other Ambulatory Visit (HOSPITAL_COMMUNITY): Payer: Self-pay

## 2024-05-12 MED ORDER — AMPHETAMINE-DEXTROAMPHETAMINE 30 MG PO TABS
30.0000 mg | ORAL_TABLET | Freq: Two times a day (BID) | ORAL | 0 refills | Status: AC
  Filled 2024-05-12: qty 60, 30d supply, fill #0

## 2024-05-13 ENCOUNTER — Other Ambulatory Visit (HOSPITAL_COMMUNITY): Payer: Self-pay

## 2024-06-01 ENCOUNTER — Other Ambulatory Visit: Payer: Self-pay

## 2024-06-01 ENCOUNTER — Encounter (INDEPENDENT_AMBULATORY_CARE_PROVIDER_SITE_OTHER): Payer: Self-pay

## 2024-06-01 NOTE — Progress Notes (Signed)
 Specialty Pharmacy Refill Coordination Note  Alexis Duffy is a 53 y.o. female contacted today regarding refills of specialty medication(s) Omalizumab  (XOLAIR )   Patient requested (Patient-Rptd) Delivery   Delivery date: 06/09/24   Verified address: (Patient-Rptd) [address] [place] [address]   Medication will be filled on 06/08/24.

## 2024-06-08 ENCOUNTER — Other Ambulatory Visit: Payer: Self-pay

## 2024-06-15 ENCOUNTER — Other Ambulatory Visit (HOSPITAL_COMMUNITY): Payer: Self-pay

## 2024-06-24 ENCOUNTER — Other Ambulatory Visit (HOSPITAL_COMMUNITY): Payer: Self-pay

## 2024-06-25 ENCOUNTER — Other Ambulatory Visit (HOSPITAL_COMMUNITY): Payer: Self-pay

## 2024-06-25 MED ORDER — AMPHETAMINE-DEXTROAMPHETAMINE 30 MG PO TABS
1.0000 | ORAL_TABLET | Freq: Two times a day (BID) | ORAL | 0 refills | Status: DC
  Filled 2024-06-25: qty 60, 30d supply, fill #0

## 2024-06-26 ENCOUNTER — Other Ambulatory Visit: Payer: Self-pay

## 2024-06-26 ENCOUNTER — Other Ambulatory Visit (HOSPITAL_COMMUNITY): Payer: Self-pay

## 2024-06-28 ENCOUNTER — Other Ambulatory Visit (HOSPITAL_COMMUNITY): Payer: Self-pay

## 2024-07-04 ENCOUNTER — Other Ambulatory Visit: Payer: Self-pay

## 2024-07-04 ENCOUNTER — Encounter (INDEPENDENT_AMBULATORY_CARE_PROVIDER_SITE_OTHER): Payer: Self-pay

## 2024-07-04 ENCOUNTER — Other Ambulatory Visit: Payer: Self-pay | Admitting: Pharmacy Technician

## 2024-07-04 NOTE — Progress Notes (Signed)
 Specialty Pharmacy Refill Coordination Note  Alexis Duffy is a 53 y.o. female contacted today regarding refills of specialty medication(s) Omalizumab  (XOLAIR )   Patient requested (Patient-Rptd) Delivery   Delivery date: 07/07/24 Verified address: (Patient-Rptd) [address] [place] [address]   Medication will be filled on 07/06/24.

## 2024-07-05 ENCOUNTER — Other Ambulatory Visit: Payer: Self-pay

## 2024-07-26 ENCOUNTER — Other Ambulatory Visit (HOSPITAL_COMMUNITY): Payer: Self-pay

## 2024-07-26 MED ORDER — AMPHETAMINE-DEXTROAMPHETAMINE 30 MG PO TABS
30.0000 mg | ORAL_TABLET | Freq: Two times a day (BID) | ORAL | 0 refills | Status: AC
  Filled 2024-07-26: qty 60, 30d supply, fill #0

## 2024-07-27 ENCOUNTER — Other Ambulatory Visit: Payer: Self-pay

## 2024-07-29 ENCOUNTER — Encounter (INDEPENDENT_AMBULATORY_CARE_PROVIDER_SITE_OTHER): Payer: Self-pay

## 2024-07-31 ENCOUNTER — Other Ambulatory Visit: Payer: Self-pay

## 2024-07-31 ENCOUNTER — Other Ambulatory Visit (HOSPITAL_COMMUNITY): Payer: Self-pay

## 2024-07-31 ENCOUNTER — Other Ambulatory Visit: Payer: Self-pay | Admitting: Pulmonary Disease

## 2024-07-31 DIAGNOSIS — J4551 Severe persistent asthma with (acute) exacerbation: Secondary | ICD-10-CM

## 2024-07-31 NOTE — Telephone Encounter (Signed)
 Pt requesting refill of specialty medication - routing to Rx team to advise.

## 2024-08-01 ENCOUNTER — Other Ambulatory Visit (HOSPITAL_COMMUNITY): Payer: Self-pay

## 2024-08-01 ENCOUNTER — Other Ambulatory Visit: Payer: Self-pay

## 2024-08-01 MED ORDER — OMALIZUMAB 150 MG/ML ~~LOC~~ SOSY
300.0000 mg | PREFILLED_SYRINGE | SUBCUTANEOUS | 2 refills | Status: AC
  Filled 2024-08-01: qty 2, 28d supply, fill #0
  Filled 2024-08-28: qty 2, 28d supply, fill #1
  Filled 2024-09-26 – 2024-09-27 (×2): qty 2, 28d supply, fill #2

## 2024-08-01 NOTE — Progress Notes (Signed)
 Specialty Pharmacy Refill Coordination Note  Alexis Duffy is a 53 y.o. female contacted today regarding refills of specialty medication(s) Omalizumab  (XOLAIR )   Patient requested Delivery   Delivery date: 08/02/24   Verified address: [address] [place] [facility] [address]   Medication will be filled on: 08/01/24

## 2024-08-01 NOTE — Telephone Encounter (Signed)
 Refill sent for XOLAIR  to [facility]: [phone]   Dose: 300mg  [place] every 28 days   Last OV: 11/15/23 Provider: Dr. Jude  Next OV: overdue - was due August 2025  Routing to scheduling team for follow-up on appt scheduling  Alexis Duffy, PharmD, BCPS Clinical Pharmacist  [facility]

## 2024-08-02 ENCOUNTER — Telehealth: Payer: Self-pay | Admitting: Adult Health

## 2024-08-02 NOTE — Telephone Encounter (Signed)
 Attempted to call patient for a follow up appointment for XOLAIR  .

## 2024-08-23 ENCOUNTER — Other Ambulatory Visit: Payer: Self-pay

## 2024-08-24 ENCOUNTER — Other Ambulatory Visit (HOSPITAL_COMMUNITY): Payer: Self-pay

## 2024-08-25 ENCOUNTER — Other Ambulatory Visit: Payer: Self-pay

## 2024-08-25 ENCOUNTER — Other Ambulatory Visit (HOSPITAL_COMMUNITY): Payer: Self-pay

## 2024-08-25 MED ORDER — AMPHETAMINE-DEXTROAMPHETAMINE 30 MG PO TABS
30.0000 mg | ORAL_TABLET | Freq: Two times a day (BID) | ORAL | 0 refills | Status: AC
  Filled 2024-09-02: qty 60, 30d supply, fill #0

## 2024-08-28 ENCOUNTER — Other Ambulatory Visit: Payer: Self-pay

## 2024-08-28 ENCOUNTER — Other Ambulatory Visit (HOSPITAL_COMMUNITY): Payer: Self-pay

## 2024-08-29 ENCOUNTER — Other Ambulatory Visit: Payer: Self-pay

## 2024-08-29 NOTE — Progress Notes (Signed)
 Specialty Pharmacy Refill Coordination Note  Alexis Duffy is a 53 y.o. female contacted today regarding refills of specialty medication(s) Omalizumab  (XOLAIR )   Patient requested Delivery   Delivery date: 09/07/24   Verified address: [address] [name] [address]   Medication will be filled on: 09/06/24

## 2024-09-02 ENCOUNTER — Other Ambulatory Visit (HOSPITAL_COMMUNITY): Payer: Self-pay

## 2024-09-06 ENCOUNTER — Other Ambulatory Visit: Payer: Self-pay

## 2024-09-21 ENCOUNTER — Other Ambulatory Visit: Payer: Self-pay

## 2024-09-21 ENCOUNTER — Encounter: Payer: Self-pay | Admitting: Pharmacist

## 2024-09-21 ENCOUNTER — Encounter: Payer: Self-pay | Admitting: Adult Health

## 2024-09-21 ENCOUNTER — Other Ambulatory Visit (HOSPITAL_COMMUNITY): Payer: Self-pay

## 2024-09-21 ENCOUNTER — Ambulatory Visit: Admitting: Adult Health

## 2024-09-21 VITALS — BP 116/82 | HR 70 | Temp 98.3°F | Ht 63.0 in | Wt 138.0 lb

## 2024-09-21 DIAGNOSIS — J189 Pneumonia, unspecified organism: Secondary | ICD-10-CM | POA: Diagnosis not present

## 2024-09-21 DIAGNOSIS — J4551 Severe persistent asthma with (acute) exacerbation: Secondary | ICD-10-CM

## 2024-09-21 DIAGNOSIS — J301 Allergic rhinitis due to pollen: Secondary | ICD-10-CM | POA: Diagnosis not present

## 2024-09-21 DIAGNOSIS — J45909 Unspecified asthma, uncomplicated: Secondary | ICD-10-CM

## 2024-09-21 DIAGNOSIS — K219 Gastro-esophageal reflux disease without esophagitis: Secondary | ICD-10-CM

## 2024-09-21 LAB — POCT EXHALED NITRIC OXIDE: FeNO level (ppb): 23 (ref ?–50)

## 2024-09-21 MED ORDER — ALBUTEROL SULFATE (2.5 MG/3ML) 0.083% IN NEBU
2.5000 mg | INHALATION_SOLUTION | Freq: Four times a day (QID) | RESPIRATORY_TRACT | 5 refills | Status: AC | PRN
  Filled 2024-09-21: qty 150, 13d supply, fill #0

## 2024-09-21 MED ORDER — BUDESONIDE-FORMOTEROL FUMARATE 160-4.5 MCG/ACT IN AERO
2.0000 | INHALATION_SPRAY | Freq: Two times a day (BID) | RESPIRATORY_TRACT | 3 refills | Status: AC
  Filled 2024-09-21: qty 30.6, 90d supply, fill #0

## 2024-09-21 MED ORDER — HYDROCODONE BIT-HOMATROP MBR 5-1.5 MG/5ML PO SOLN
5.0000 mL | Freq: Every evening | ORAL | 0 refills | Status: AC | PRN
  Filled 2024-09-21 (×2): qty 120, 24d supply, fill #0

## 2024-09-21 MED ORDER — BENZONATATE 200 MG PO CAPS
200.0000 mg | ORAL_CAPSULE | Freq: Three times a day (TID) | ORAL | 2 refills | Status: AC | PRN
  Filled 2024-09-21 (×2): qty 45, 15d supply, fill #0
  Filled 2024-11-06: qty 45, 15d supply, fill #1

## 2024-09-21 MED ORDER — ALBUTEROL SULFATE HFA 108 (90 BASE) MCG/ACT IN AERS
1.0000 | INHALATION_SPRAY | Freq: Four times a day (QID) | RESPIRATORY_TRACT | 5 refills | Status: AC | PRN
  Filled 2024-09-21: qty 6.7, 25d supply, fill #0

## 2024-09-21 MED ORDER — MONTELUKAST SODIUM 10 MG PO TABS
10.0000 mg | ORAL_TABLET | Freq: Every day | ORAL | 3 refills | Status: AC
  Filled 2024-09-21: qty 90, 90d supply, fill #0

## 2024-09-21 MED ORDER — PREDNISONE 10 MG PO TABS
ORAL_TABLET | ORAL | 0 refills | Status: AC
  Filled 2024-09-21: qty 20, 8d supply, fill #0

## 2024-09-21 NOTE — Patient Instructions (Addendum)
 Order for new [employer] Breztri 2 puffs Twice daily until sample is gone, then resume Symbicort  2 puffs twice daily, rinse after use Prednisone  taper to have on hold if symptoms are not improving.  Continue on Singulair  10 mg daily Albuterol  inhaler as needed Continue on Xolair  injections. Begin Allegra 180mg  daily  Add Flonase  nasal As needed   Add Pepcid  20mg  At bedtime  for 4 weeks and then As needed   Add Liquid Mucinex DM Twice daily  for cough As needed   Tessalon  Three times a day  for cough As needed   Hydromet 1 tsp At bedtime  As needed  severe cough , may make you sleepy  CT chest in 3 months.  Follow-up with Dr. Jude in 3 months with PFTs and as needed-[facility].

## 2024-09-21 NOTE — Progress Notes (Signed)
 @Patient  ID: Alexis Duffy, female    DOB: July 14, 1971, 53 y.o.   MRN: [id number]  Chief Complaint  Patient presents with   Medical Management of Chronic Issues    Xolair  f/u    Referring provider: Leonce Sink, MD  HPI: 53 year old female never smoker followed for severe persistent asthma with allergic phenotype RN -[facility]     TEST/EVENTS : Reviewed 09/21/2024  Allergy  panel November 2014 multiple triggers including dust, dog dander, cat dander, grass, tree, ragweed, elevated IgE at 473  11/2023 Feno 10ppb  09/21/24 FENO 23PPB    Discussed the use of AI scribe software for clinical note transcription with the patient, who gave verbal consent to proceed.  History of Present Illness Alexis Duffy is a 53 year old female with asthma who presents with persistent respiratory symptoms following pneumonia.  She experienced a severe respiratory illness that began a couple of days before Halloween. Initially, her symptoms seemed to improve but then worsened the following week. Due to a busy schedule, she delayed seeking medical attention until November 6th, when she visited urgent care and was prescribed Levaquin  and prednisone . Despite completing the five-day course of antibiotics, her symptoms persisted, including a change in her cough, pain on the right side under her breast, and difficulty breathing.  Her cough is described as sounding like 'going through a tunnel' and was not typical for her. She experienced a low-grade fever, with the highest recorded at 99.32F. Her family, including her grandkids and husband, also experienced upper respiratory symptoms around the same time. On November 10th, she went to the emergency room where a chest x-ray was clear, and a CT scan was performed that showed bilateral ground glass opacities.. She was then prescribed a 10-day course of Zithromax and cefdinir, along with a 12-day tapered dose of prednisone .  Despite these treatments, she continues  to experience wheezing and a residual cough, which worsens in cold weather. She has been using Delsym, two teaspoons twice a day, and was prescribed Hycodan for bedtime, but her pharmacy did not fill it due to insurance issues. She reports coughing less at night but still experiences symptoms.  She is feeling better but is not back to baseline.  She has tried to go back to the gym and is back to work.  Her work schedule has been affected by her illness, leading to multiple call-offs. She has a supportive boss who has been understanding of her situation.  She is currently using Symbicort  twice a day, albuterol  as needed, and has a nebulizer that is ten years old. She also takes Singulair  and receives Xolair  injections every 28 days.  Endorses compliance.  She reports occasional reflux and has been taking Pepcid  as needed. She has been using Benadryl for skin breakouts, possibly due to a change in laundry detergent. She has not been using Zyrtec or ClaritinShe experiences thick mucus and feels like she is 'strangling' at times due to throat inflammation. She uses liquid Mucinex DM twice a day for her cough. No known allergies except aspirin.  Mucus remains white.  No fever.     Allergies[1]  Immunization History  Administered Date(s) Administered   Influenza Inj Mdck Quad Pf 07/21/2017   Influenza Split 05/05/2013   Influenza, Seasonal, Injecte, Preservative Fre 07/12/2014   Influenza,inj,Quad PF,6+ Mos 07/12/2014, 07/24/2018   Influenza,inj,quad, With Preservative 07/21/2017   Influenza-Unspecified 07/03/2015, 07/21/2017, 07/04/2019, 07/31/2020   MMR 05/01/2013   PFIZER(Purple Top)SARS-COV-2 Vaccination 09/25/2019, 10/16/2019, 08/20/2020   PNEUMOCOCCAL CONJUGATE-20 05/13/2022  PPD Test 05/01/2013, 06/01/2013   Pneumococcal Polysaccharide-23 05/05/2013, 06/01/2013   Td 07/05/2006   Tdap 01/24/2013   Varicella 01/24/2013   Zoster Recombinant(Shingrix) 05/13/2022   Zoster, Live 05/01/2013     Past Medical History:  Diagnosis Date   ADHD (attention deficit hyperactivity disorder)    Allergic rhinitis    Asthma    Hypothyroid    Low testosterone      Tobacco History: Tobacco Use History[2] Counseling given: Not Answered   Outpatient Medications Prior to Visit  Medication Sig Dispense Refill   albuterol  (PROVENTIL ) (2.5 MG/3ML) 0.083% nebulizer solution Take 3 mLs (2.5 mg total) by nebulization every 6 (six) hours as needed for wheezing or shortness of breath. 150 mL 3   albuterol  (VENTOLIN  HFA) 108 (90 Base) MCG/ACT inhaler Inhale 1-2 puffs into the lungs every 6 (six) hours as needed for wheezing or shortness of breath. 6.7 g 3   ALPRAZolam  (XANAX ) 0.5 MG tablet Take 1 tablet (0.5 mg total) by mouth 2 (two) times daily as needed. 60 tablet 2   amphetamine -dextroamphetamine  (ADDERALL) 30 MG tablet Take 1 tablet by mouth 2 (two) times daily. 60 tablet 0   budesonide -formoterol  (SYMBICORT ) 160-4.5 MCG/ACT inhaler Inhale 2 puffs into the lungs in the morning and at bedtime. 10.2 g 6   EPINEPHrine  0.3 mg/0.3 mL IJ SOAJ injection Inject 0.3 mg into the muscle as needed for anaphylaxis. 1 each 2   metoprolol tartrate (LOPRESSOR) 25 MG tablet Take 25 mg by mouth 2 (two) times daily.     montelukast  (SINGULAIR ) 10 MG tablet Take 1 tablet (10 mg total) by mouth at bedtime. 90 tablet 4   omalizumab  (XOLAIR ) 150 MG/ML prefilled syringe Inject 300 mg into the skin every 28 (twenty-eight) days. 2 mL 2   ALPRAZolam  (XANAX ) 0.5 MG tablet Take one tablet by mouth twice daily if needed. 60 tablet 2   ALPRAZolam  (XANAX ) 0.5 MG tablet Take 1 tablet by mouth 2 times a day as needed (Patient not taking: Reported on 09/21/2024) 60 tablet 2   ALPRAZolam  (XANAX ) 0.5 MG tablet Take 1 tablet (0.5 mg total) by mouth 2 (two) times daily as needed. (Patient not taking: Reported on 09/21/2024) 60 tablet 2   ALPRAZolam  (XANAX ) 0.5 MG tablet Take 1 tablet (0.5 mg total) by mouth 2 (two) times daily as  needed. (Patient not taking: Reported on 09/21/2024) 60 tablet 2   amphetamine -dextroamphetamine  (ADDERALL) 30 MG tablet Take 1 tablet by mouth 2 times a day 60 tablet 0   amphetamine -dextroamphetamine  (ADDERALL) 30 MG tablet Take 1 tablet by mouth 2 (two) times daily. (Patient not taking: Reported on 09/21/2024) 60 tablet 0   amphetamine -dextroamphetamine  (ADDERALL) 30 MG tablet Take 1 tablet by mouth 2 (two) times daily. (Patient not taking: Reported on 09/21/2024) 60 tablet 0   amphetamine -dextroamphetamine  (ADDERALL) 30 MG tablet Take 1 tablet by mouth 2 (two) times daily. (Patient not taking: Reported on 09/21/2024) 60 tablet 0   amphetamine -dextroamphetamine  (ADDERALL) 30 MG tablet Take 1 tablet by mouth 2 (two) times daily. (Patient not taking: Reported on 09/21/2024) 60 tablet 0   amphetamine -dextroamphetamine  (ADDERALL) 30 MG tablet Take 1 tablet by mouth 2 (two) times daily. (Patient not taking: Reported on 09/21/2024) 60 tablet 0   famotidine  (PEPCID ) 20 MG tablet Take 1 tablet (20 mg total) by mouth 2 (two) times daily. (Patient not taking: Reported on 09/21/2024) 30 tablet 3   Facility-Administered Medications Prior to Visit  Medication Dose Route Frequency Provider Last Rate Last Admin  methylPREDNISolone  acetate (DEPO-MEDROL ) injection 40 mg  40 mg Intramuscular Once Groce, Sarah F, NP         Review of Systems:   Constitutional:   No  weight loss, night sweats,  Fevers, chills,+ fatigue, or  lassitude.  HEENT:   No headaches,  Difficulty swallowing,  Tooth/dental problems, or  Sore throat,                No sneezing, itching, ear ache, nasal congestion, post nasal drip,   CV:  No chest pain,  Orthopnea, PND, swelling in lower extremities, anasarca, dizziness, palpitations, syncope.   GI  No heartburn, indigestion, abdominal pain, nausea, vomiting, diarrhea, change in bowel habits, loss of appetite, bloody stools.   Resp:  No chest wall deformity  Skin: no rash or  lesions.  GU: no dysuria, change in color of urine, no urgency or frequency.  No flank pain, no hematuria   MS:  No joint pain or swelling.  No decreased range of motion.  No back pain.    Physical Exam  BP 116/82   Pulse 70   Temp 98.3 F (36.8 C)   Ht 5' 3 (1.6 m) Comment: Per pt  Wt 138 lb (62.6 kg) Comment: Per pt  SpO2 95% Comment: RA  BMI 24.45 kg/m   GEN: A/Ox3; pleasant , NAD, well nourished    HEENT:  [place]/AT,  NOSE-clear, THROAT-clear, no lesions, no postnasal drip or exudate noted.   NECK:  Supple w/ fair ROM; no JVD; normal carotid impulses w/o bruits; no thyromegaly or nodules palpated; no lymphadenopathy.    RESP  Clear  P & A; w/o, wheezes/ rales/ or rhonchi. no accessory muscle use, no dullness to percussion ++ coughing   CARD:  RRR, no m/r/g, no peripheral edema, pulses intact, no cyanosis or clubbing.  GI:   Soft & nt; nml bowel sounds; no organomegaly or masses detected.   Musco: Warm bil, no deformities or joint swelling noted.   Neuro: alert, no focal deficits noted.    Skin: Warm, no lesions or rashes    Lab Results:Reviewed 09/21/2024   CBC No results found for: WBC, RBC, HGB, HCT, PLT, MCV, MCH, MCHC, RDW, LYMPHSABS, MONOABS, EOSABS, BASOSABS  BMET No results found for: NA, K, CL, CO2, GLUCOSE, BUN, CREATININE, CALCIUM, GFRNONAA, GFRAA  BNP No results found for: BNP  ProBNP No results found for: PROBNP  Imaging: No results found.  Administration History     None           No data to display          Lab Results  Component Value Date   NITRICOXIDE 56 10/21/2017        No data to display              Assessment & Plan:   Assessment and Plan Assessment & Plan Severe persistent allergic asthma with acute exacerbation  -resolving  Severe persistent allergic asthma recently exacerbated, likely due to a viral illness causing pneumonia and post-infectious  pneumonitis. Symptoms include persistent cough, shortness of breath, and wheezing, indicating ongoing airway inflammation. Previous treatments included Levaquin , azithromycin prednisone , and cefdinir.  Hold on additional prednisone , but a prescription is available if symptoms worsen or do not improve with ongoing wheezing.SABRA Refilled albuterol  inhaler and nebulizer solution. Ordered a new nebulizer machine. Use Breztri sample as a step-up therapy then resume Symbicort  twice daily.  And albuterol  nebulizer daily for the next few days. Please contact office for  sooner follow up if symptoms do not improve or worsen or seek emergency care    Pneumonia with post-infectious pneumonitis  -resolving  Pneumonia with post-infectious pneumonitis likely secondary to a viral illness. CT scan showed ground glass opacities,   while chest x-ray was clear. Symptoms include persistent cough and shortness of breath. A follow-up CT scan is ordered in three months to ensure resolution of pneumonia/pneumonitis. Monitor symptoms and contact provider if not improving.  Allergic rhinitis-milld flare  Allergic rhinitis contributes to postnasal drip and sinus drainage, potentially exacerbating asthma symptoms. Symptoms include sinus drainage. Start Allegra daily for allergic rhinitis. Use Flonase  as needed for postnasal drip and consider saline spray to maintain nasal moisture.  Gastroesophageal reflux disease  -mild flare  Intermittent gastroesophageal reflux disease may contribute to throat irritation and cough, with symptoms of occasional heartburn and indigestion. Take Pepcid  daily for the next four to six weeks to manage reflux symptoms.    Plan  Patient Instructions  Order for new Neb machine  Use Breztri 2 puffs Twice daily until sample is gone, then resume Symbicort  2 puffs twice daily, rinse after use Prednisone  taper to have on hold if symptoms are not improving.  Continue on Singulair  10 mg daily Albuterol   inhaler as needed Continue on Xolair  injections. Begin Allegra 180mg  daily  Add Flonase  nasal As needed   Add Pepcid  20mg  At bedtime  for 4 weeks and then As needed   Add Liquid Mucinex DM Twice daily  for cough As needed   Tessalon  Three times a day  for cough As needed   Hydromet 1 tsp At bedtime  As needed  severe cough , may make you sleepy  CT chest in 3 months.  Follow-up with Dr. Jude in 3 months with PFTs and as needed-[facility].       Adalynn Corne, NP 09/21/2024      [1]  Allergies Allergen Reactions   Asa [Aspirin]     Increased SOB  [2]  Social History Tobacco Use  Smoking Status Never  Smokeless Tobacco Never

## 2024-09-22 ENCOUNTER — Other Ambulatory Visit (HOSPITAL_BASED_OUTPATIENT_CLINIC_OR_DEPARTMENT_OTHER): Payer: Self-pay

## 2024-09-26 ENCOUNTER — Other Ambulatory Visit: Payer: Self-pay

## 2024-09-27 ENCOUNTER — Other Ambulatory Visit: Payer: Self-pay

## 2024-09-29 ENCOUNTER — Other Ambulatory Visit (HOSPITAL_COMMUNITY): Payer: Self-pay

## 2024-09-29 ENCOUNTER — Other Ambulatory Visit: Payer: Self-pay

## 2024-09-29 NOTE — Progress Notes (Signed)
 Specialty Pharmacy Refill Coordination Note  Alexis Duffy is a 53 y.o. female contacted today regarding refills of specialty medication(s) Omalizumab  (XOLAIR )   Patient requested Delivery   Delivery date: 10/11/24   Verified address: [address] [place] [facility] [address]   Medication will be filled on: 10/10/24

## 2024-10-08 ENCOUNTER — Other Ambulatory Visit (HOSPITAL_COMMUNITY): Payer: Self-pay

## 2024-10-09 ENCOUNTER — Other Ambulatory Visit: Payer: Self-pay

## 2024-10-09 ENCOUNTER — Other Ambulatory Visit (HOSPITAL_COMMUNITY): Payer: Self-pay

## 2024-10-09 MED ORDER — AMPHETAMINE-DEXTROAMPHETAMINE 30 MG PO TABS
1.0000 | ORAL_TABLET | Freq: Two times a day (BID) | ORAL | 0 refills | Status: DC
  Filled 2024-10-09: qty 60, 30d supply, fill #0

## 2024-10-10 ENCOUNTER — Other Ambulatory Visit: Payer: Self-pay

## 2024-10-17 ENCOUNTER — Other Ambulatory Visit: Payer: Self-pay

## 2024-10-26 ENCOUNTER — Other Ambulatory Visit (HOSPITAL_COMMUNITY): Payer: Self-pay

## 2024-10-27 ENCOUNTER — Encounter (HOSPITAL_BASED_OUTPATIENT_CLINIC_OR_DEPARTMENT_OTHER)

## 2024-10-30 NOTE — Addendum Note (Signed)
 Addended by: Drew Lips L on: 10/30/2024 12:46 PM   Modules accepted: Orders

## 2024-10-31 ENCOUNTER — Other Ambulatory Visit: Payer: Self-pay

## 2024-10-31 NOTE — Addendum Note (Signed)
 Addended by: SHAREN DELON HERO on: 10/31/2024 03:47 PM   Modules accepted: Orders

## 2024-11-02 ENCOUNTER — Other Ambulatory Visit: Payer: Self-pay

## 2024-11-02 ENCOUNTER — Other Ambulatory Visit (HOSPITAL_COMMUNITY): Payer: Self-pay

## 2024-11-03 ENCOUNTER — Other Ambulatory Visit: Payer: Self-pay

## 2024-11-03 ENCOUNTER — Encounter (HOSPITAL_BASED_OUTPATIENT_CLINIC_OR_DEPARTMENT_OTHER)

## 2024-11-03 DIAGNOSIS — J4551 Severe persistent asthma with (acute) exacerbation: Secondary | ICD-10-CM

## 2024-11-06 ENCOUNTER — Other Ambulatory Visit (HOSPITAL_COMMUNITY): Payer: Self-pay

## 2024-11-06 ENCOUNTER — Other Ambulatory Visit: Payer: Self-pay

## 2024-11-07 ENCOUNTER — Other Ambulatory Visit (HOSPITAL_COMMUNITY): Payer: Self-pay

## 2024-11-07 ENCOUNTER — Other Ambulatory Visit: Payer: Self-pay

## 2024-11-07 MED ORDER — ALPRAZOLAM 0.5 MG PO TABS
0.5000 mg | ORAL_TABLET | Freq: Two times a day (BID) | ORAL | 1 refills | Status: AC | PRN
  Filled 2024-11-07: qty 60, 30d supply, fill #0

## 2024-11-08 ENCOUNTER — Other Ambulatory Visit (HOSPITAL_COMMUNITY): Payer: Self-pay

## 2024-11-08 ENCOUNTER — Other Ambulatory Visit: Payer: Self-pay

## 2024-11-09 ENCOUNTER — Other Ambulatory Visit (HOSPITAL_COMMUNITY): Payer: Self-pay

## 2024-11-09 MED ORDER — AMPHETAMINE-DEXTROAMPHETAMINE 30 MG PO TABS
1.0000 | ORAL_TABLET | Freq: Two times a day (BID) | ORAL | 0 refills | Status: AC
  Filled 2024-11-09: qty 60, 30d supply, fill #0

## 2024-11-10 ENCOUNTER — Other Ambulatory Visit: Payer: Self-pay

## 2024-11-10 ENCOUNTER — Other Ambulatory Visit (HOSPITAL_COMMUNITY): Payer: Self-pay

## 2024-11-16 ENCOUNTER — Ambulatory Visit (HOSPITAL_BASED_OUTPATIENT_CLINIC_OR_DEPARTMENT_OTHER): Admitting: Pulmonary Disease
